# Patient Record
Sex: Male | Born: 1960 | Race: White | Hispanic: No | Smoking: Never smoker
Health system: Southern US, Community
[De-identification: ages and names within clinical notes are randomized; demographics above are authoritative.]

## PROBLEM LIST (undated history)

## (undated) DIAGNOSIS — I10 Essential (primary) hypertension: Secondary | ICD-10-CM

## (undated) DIAGNOSIS — M109 Gout, unspecified: Secondary | ICD-10-CM

## (undated) DIAGNOSIS — I219 Acute myocardial infarction, unspecified: Secondary | ICD-10-CM

## (undated) HISTORY — PX: CAROTID STENT INSERTION: SHX5766

## (undated) HISTORY — PX: CORONARY ARTERY BYPASS GRAFT: SHX141

---

## 2012-12-10 ENCOUNTER — Encounter: Payer: Self-pay | Admitting: *Deleted

## 2012-12-10 ENCOUNTER — Emergency Department (INDEPENDENT_AMBULATORY_CARE_PROVIDER_SITE_OTHER)
Admission: EM | Admit: 2012-12-10 | Discharge: 2012-12-10 | Disposition: A | Discharge status: Home / Self Care | Payer: BC Managed Care – PPO | Source: Home / Self Care | Attending: Family Medicine | Admitting: Family Medicine

## 2012-12-10 ENCOUNTER — Emergency Department (INDEPENDENT_AMBULATORY_CARE_PROVIDER_SITE_OTHER): Payer: BC Managed Care – PPO

## 2012-12-10 DIAGNOSIS — M25539 Pain in unspecified wrist: Secondary | ICD-10-CM

## 2012-12-10 DIAGNOSIS — M109 Gout, unspecified: Secondary | ICD-10-CM

## 2012-12-10 HISTORY — DX: Essential (primary) hypertension: I10

## 2012-12-10 HISTORY — DX: Gout, unspecified: M10.9

## 2012-12-10 HISTORY — DX: Acute myocardial infarction, unspecified: I21.9

## 2012-12-10 MED ORDER — PREDNISONE 50 MG PO TABS: ORAL_TABLET | ORAL | Status: DC

## 2012-12-10 NOTE — ED Notes (Signed)
Patient c/o right hand pain x 3 days unsure if injured works as a Therapist, sports. Declines tingling or numbness. States tried some of mother's Gout medicine and now having diarrhea.

## 2012-12-10 NOTE — ED Provider Notes (Signed)
History     CSN: [id number]  Arrival date & time 12/10/12  1255   First MD Initiated Contact with Patient 12/10/12 1307      Chief Complaint  Patient presents with  . Hand Pain   HPI  R wrist pain x  3 days.  Pt states that he noticed pain while at work but is unsure if he injured his hand.  Pain has been primarily in the wrist. Diffuse in nature. Feels more like an ache.  Has a hx/o gout in the wrist in the past.  Pain feels similar to previous gout flares.  Pt took some of mother's gout medication. Unsure of name of medication. Medication gave pt severe diarrhea.  Has had minimal to mild improvement in sxs.  No fevers or chills.  No hand numbness or paresthesias.  Mild weakness.   Past Medical History  Diagnosis Date  . Hypertension   . Gout   . Heart attack     Past Surgical History  Procedure Date  . Carotid stent insertion     No family history on file.  History  Substance Use Topics  . Smoking status: Not on file  . Smokeless tobacco: Not on file  . Alcohol Use:       Review of Systems  All other systems reviewed and are negative.    Allergies  Review of patient's allergies indicates no known allergies.  Home Medications   Current Outpatient Rx  Name  Route  Sig  Dispense  Refill  . ATORVASTATIN CALCIUM 80 MG PO TABS   Oral   Take 80 mg by mouth daily.         Marland Kitchen LISINOPRIL 20 MG PO TABS   Oral   Take 20 mg by mouth daily.           BP 127/85  Pulse 83  Temp 98.1 F (36.7 C) (Oral)  Resp 16  Ht 6\' 2"  (1.88 m)  Wt 283 lb 8 oz (128.595 kg)  BMI 36.40 kg/m2  SpO2 96%  Physical Exam  Constitutional:       Obese    HENT:  Head: Normocephalic and atraumatic.  Eyes: Conjunctivae normal are normal. Pupils are equal, round, and reactive to light.  Neck: Normal range of motion. Neck supple.  Cardiovascular: Normal rate and regular rhythm.   Pulmonary/Chest: Effort normal and breath sounds normal.  Abdominal: Soft.    Musculoskeletal:       Hands:      R wrist swelling.  Decreased ROM 2/2 pain  Neurovascularly intact distally  + wrist tenderness diffusely    Neurological: He is alert.  Skin: Skin is warm.    ED Course  Procedures (including critical care time)  Labs Reviewed - No data to display Dg Wrist Complete Right  12/10/2012  *RADIOLOGY REPORT*  Clinical Data: Hand and wrist pain.  No known injury.  RIGHT WRIST - COMPLETE 3+ VIEW  Comparison: None.  Findings: Imaged bones, joints and soft tissues appear normal.  IMPRESSION: Negative exam.   Original Report Authenticated By: Holley Dexter, M.D.    Dg Hand Complete Right  12/10/2012  *RADIOLOGY REPORT*  Clinical Data: Hand and wrist pain for 3 days, no known injury  RIGHT HAND - COMPLETE 3+ VIEW  Comparison: None  Findings: Question mild diffuse soft tissue swelling. Joint spaces preserved. Osseous mineralization probably normal for technique. Fingers partially superimposed on lateral view limiting evaluation. No acute fracture, dislocation or bone destruction.  IMPRESSION: No  acute osseous abnormalities.   Original Report Authenticated By: Ulyses Southward, M.D.      1. Wrist pain   2. Gout       MDM  xrays negative Suspect this is a gout flare.  Will place on course of prednisone for treatment.  Plan for follow up with sports medicine if not improved.     The patient and/or caregiver has been counseled thoroughly with regard to treatment plan and/or medications prescribed including dosage, schedule, interactions, rationale for use, and possible side effects and they verbalize understanding. Diagnoses and expected course of recovery discussed and will return if not improved as expected or if the condition worsens. Patient and/or caregiver verbalized understanding.             Doree Albee, MD 12/10/12 [phone]

## 2013-01-01 DIAGNOSIS — Z955 Presence of coronary angioplasty implant and graft: Secondary | ICD-10-CM

## 2013-01-01 DIAGNOSIS — I252 Old myocardial infarction: Secondary | ICD-10-CM

## 2013-01-01 DIAGNOSIS — I10 Essential (primary) hypertension: Secondary | ICD-10-CM | POA: Insufficient documentation

## 2013-01-01 HISTORY — DX: Essential (primary) hypertension: I10

## 2013-01-01 HISTORY — DX: Presence of coronary angioplasty implant and graft: Z95.5

## 2013-01-01 HISTORY — DX: Old myocardial infarction: I25.2

## 2013-01-23 DIAGNOSIS — L918 Other hypertrophic disorders of the skin: Secondary | ICD-10-CM | POA: Insufficient documentation

## 2016-10-01 ENCOUNTER — Encounter: Payer: Self-pay | Admitting: *Deleted

## 2016-10-01 ENCOUNTER — Emergency Department
Admission: EM | Admit: 2016-10-01 | Discharge: 2016-10-01 | Disposition: A | Discharge status: Home / Self Care | Payer: Self-pay | Source: Home / Self Care | Attending: Family Medicine | Admitting: Family Medicine

## 2016-10-01 DIAGNOSIS — I10 Essential (primary) hypertension: Secondary | ICD-10-CM

## 2016-10-01 DIAGNOSIS — L02213 Cutaneous abscess of chest wall: Secondary | ICD-10-CM

## 2016-10-01 MED ORDER — DOXYCYCLINE HYCLATE 100 MG PO CAPS: 100.0000 mg | ORAL_CAPSULE | Freq: Two times a day (BID) | ORAL | 0 refills | Status: DC

## 2016-10-01 MED ORDER — HYDROCODONE-ACETAMINOPHEN 5-325 MG PO TABS: 1.0000 | ORAL_TABLET | Freq: Four times a day (QID) | ORAL | 0 refills | Status: DC | PRN

## 2016-10-01 NOTE — ED Triage Notes (Signed)
Pt c/o 3 days of an abscess to central chest. No drainage yet but is coming to a head today. Feels well otherwise.

## 2016-10-01 NOTE — Discharge Instructions (Signed)
°  Norco/Vicodin (hydrocodone-acetaminophen) is a narcotic pain medication, do not combine these medications with others containing tylenol. While taking, do not drink alcohol, drive, or perform any other activities that requires focus while taking these medications.  ° °

## 2016-10-01 NOTE — ED Provider Notes (Signed)
CSN: [id number]     Arrival date & time 10/01/16  1612 History   First MD Initiated Contact with Patient 10/01/16 1658     Chief Complaint  Patient presents with  . Abscess   (Consider location/radiation/quality/duration/timing/severity/associated sxs/prior Treatment) HPI Jon Mcbride is a 55 y.o. male presenting to UC with c/o 3 days of gradually worsening redness, swelling and pain in center of his chest along his sternotomy scar from open heart surgery 1 year ago.  Pt notes the skin redness and swelling started just Left of the scar but gradually moved into the center of his chest onto his scar.  Area is tender to touch. Swelling has gradually improved with warm water and compresses but no bleeding or drainage from area.  Pain is 1/10 at this time but worse with palpation. Denies fever, chills, n/v/d.   Elevated BP in triage. Pt reports hx of known HTN and has been off his BP medication for over 1 year due to moving to area a few months ago and not having a PCP.  Pt has a new pt appointment with a PCP next week.  He notes his BP was lower than in triage at home but notes he is nervous about today's visit as he thinks the area needs to be drained.   Past Medical History:  Diagnosis Date  . Gout   . Heart attack   . Hypertension    Past Surgical History:  Procedure Laterality Date  . CAROTID STENT INSERTION     Family History  Problem Relation Age of Onset  . Hypertension Father    Social History  Substance Use Topics  . Smoking status: Never Smoker  . Smokeless tobacco: Never Used  . Alcohol use No    Review of Systems  Constitutional: Negative for chills, fatigue and fever.  Respiratory: Negative for chest tightness and shortness of breath.   Cardiovascular: Positive for chest pain (along scar on chest). Negative for palpitations.  Gastrointestinal: Negative for diarrhea, nausea and vomiting.  Skin: Positive for color change. Negative for wound.  Neurological: Negative  for dizziness, light-headedness and headaches.    Allergies  Review of patient's allergies indicates no known allergies.  Home Medications   Prior to Admission medications   Medication Sig Start Date End Date Taking? Authorizing Provider  aspirin 81 MG chewable tablet Chew by mouth daily.   Yes Historical Provider, MD  doxycycline (VIBRAMYCIN) 100 MG capsule Take 1 capsule (100 mg total) by mouth 2 (two) times daily. One po bid x 7 days 10/01/16   Junius Finner, PA-C  HYDROcodone-acetaminophen (NORCO/VICODIN) 5-325 MG tablet Take 1-2 tablets by mouth every 6 (six) hours as needed. 10/01/16   Junius Finner, PA-C   Meds Ordered and Administered this Visit  Medications - No data to display  BP (!) 160/111 (BP Location: Right Arm) Comment: 180/115 left arm  Pulse 88   Temp 98.8 F (37.1 C) (Oral)   Ht 6\' 2"  (1.88 m)   Wt 287 lb (130.2 kg)   SpO2 97%   BMI 36.85 kg/m  No data found.   Physical Exam  Constitutional: He is oriented to person, place, and time. He appears well-developed and well-nourished. No distress.  Pt sitting on exam chair, NAD  HENT:  Head: Normocephalic and atraumatic.  Eyes: EOM are normal.  Neck: Normal range of motion.  Cardiovascular: Normal rate and regular rhythm.   Pulmonary/Chest: Effort normal and breath sounds normal. No respiratory distress. He has no wheezes. He  has no rales. He exhibits tenderness.    Hypertrophic sternotomy scar- 2.5cm area of erythema and edema with centralized mild fluctuance and small pustular type appearance to skin. No active bleeding or discharge. Mildly tender.  Musculoskeletal: Normal range of motion.  Neurological: He is alert and oriented to person, place, and time.  Skin: Skin is warm and dry. He is not diaphoretic. There is erythema.  Psychiatric: He has a normal mood and affect. His behavior is normal.  Nursing note and vitals reviewed.   Urgent Care Course   Clinical Course    .Marland Kitchen.Incision and  Drainage Date/Time: 10/01/2016 5:53 PM Performed by: Junius Finner'MALLEY, Durwin Davisson Authorized by: Donna ChristenBEESE, STEPHEN A   Consent:    Consent obtained:  Verbal   Consent given by:  Patient   Risks discussed:  Bleeding, incomplete drainage, pain and infection   Alternatives discussed:  No treatment, alternative treatment, observation and delayed treatment Location:    Type:  Abscess   Size:  2.5   Location:  Trunk   Trunk location:  Chest Pre-procedure details:    Skin preparation:  Betadine Anesthesia (see MAR for exact dosages):    Anesthesia method:  Local infiltration   Local anesthetic:  Lidocaine 1% w/o epi Procedure type:    Complexity:  Simple Procedure details:    Needle aspiration: no     Incision types:  Elliptical   Incision depth:  Dermal   Scalpel blade:  11   Wound management:  Probed and deloculated   Drainage:  Bloody and purulent   Drainage amount:  Scant   Wound treatment:  Wound left open   Packing materials:  None Post-procedure details:    Patient tolerance of procedure:  Tolerated well, no immediate complications   (including critical care time)  Labs Review Labs Reviewed - No data to display  Imaging Review No results found.   MDM   1. Cutaneous abscess of chest wall   2. Uncontrolled hypertension    Pt presenting to UC with an abscess along hypertrophic sternotomy scar for about 3 days, gradually worsening.  No fever, n/v/d.   I&D performed- bloody discharge, scant pus (not enough to send for culture) Bandage applied.  BP elevated- pt aware his BP is uncontrolled, plans to f/u with a new PCP next week to get started back on his medications.  Rx: Doxycycline and norco Pt advised to f/u in 2-3 days for a wound recheck Home care instructions provided. Patient verbalized understanding and agreement with treatment plan.    Junius FinnerErin O'Malley, PA-C 10/01/16 1756

## 2016-10-04 ENCOUNTER — Telehealth: Payer: Self-pay | Admitting: *Deleted

## 2016-10-04 NOTE — Telephone Encounter (Signed)
Callback: Pt reports the site on his chest is much improved. Encouraged to complete antibiotic course.

## 2017-06-03 ENCOUNTER — Encounter: Payer: Self-pay | Admitting: Osteopathic Medicine

## 2017-06-03 ENCOUNTER — Ambulatory Visit (INDEPENDENT_AMBULATORY_CARE_PROVIDER_SITE_OTHER): Payer: 59 | Admitting: Osteopathic Medicine

## 2017-06-03 VITALS — BP 143/82 | HR 75 | Ht 74.0 in | Wt 290.0 lb

## 2017-06-03 DIAGNOSIS — Z1159 Encounter for screening for other viral diseases: Secondary | ICD-10-CM

## 2017-06-03 DIAGNOSIS — Z951 Presence of aortocoronary bypass graft: Secondary | ICD-10-CM

## 2017-06-03 DIAGNOSIS — L400 Psoriasis vulgaris: Secondary | ICD-10-CM

## 2017-06-03 DIAGNOSIS — Z125 Encounter for screening for malignant neoplasm of prostate: Secondary | ICD-10-CM | POA: Diagnosis not present

## 2017-06-03 DIAGNOSIS — I10 Essential (primary) hypertension: Secondary | ICD-10-CM

## 2017-06-03 HISTORY — DX: Presence of aortocoronary bypass graft: Z95.1

## 2017-06-03 MED ORDER — ATORVASTATIN CALCIUM 80 MG PO TABS: 80.0000 mg | ORAL_TABLET | Freq: Every day | ORAL | 1 refills | Status: DC

## 2017-06-03 MED ORDER — LOSARTAN POTASSIUM 25 MG PO TABS
25.0000 mg | ORAL_TABLET | Freq: Every day | ORAL | 1 refills | Status: DC
Start: 2017-06-03 — End: 2017-08-01

## 2017-06-03 MED ORDER — ASPIRIN 81 MG PO TBEC: 81.0000 mg | DELAYED_RELEASE_TABLET | Freq: Every day | ORAL | 1 refills | Status: DC

## 2017-06-03 MED ORDER — METOPROLOL SUCCINATE ER 25 MG PO TB24
25.0000 mg | ORAL_TABLET | Freq: Every day | ORAL | 1 refills | Status: DC
Start: 2017-06-03 — End: 2017-08-01

## 2017-06-03 MED ORDER — BETAMETHASONE DIPROPIONATE 0.05 % EX CREA: TOPICAL_CREAM | Freq: Two times a day (BID) | CUTANEOUS | 3 refills | Status: DC

## 2017-06-03 NOTE — Progress Notes (Signed)
HPI: Jon Mcbride is a 56 y.o. male  who presents to [facility] today, 06/03/17,  for chief complaint of:  Chief Complaint  Patient presents with  . Establish Care    Very pleasant new patient here to establish care. History of MI with subsequent CABG. He was without insurance for some time so on no medications for secondary prevention other than beta blocker with carvedilol. No chest pain, pressure, shortness of breath. No home blood pressures to report that he says he has a home blood pressure cuff.    Previous records reviewed: Last annual physical in 2014, medications included sildenafil 50 mg as needed, atorvastatin 80 mg daily, carvedilol 6.25 mg twice a day, clopidogrel 75 mg, lisinopril 20 mg daily, Nitrostat as needed. History of MI and coronary artery stent. Blood pressure at that time was 110/76.    Past medical, surgical, social and family history reviewed: There are no active problems to display for this patient.  Past Surgical History:  Procedure Laterality Date  . CAROTID STENT INSERTION     Social History  Substance Use Topics  . Smoking status: Never Smoker  . Smokeless tobacco: Never Used  . Alcohol use No   Family History  Problem Relation Age of Onset  . Hypertension Father   . Hyperlipidemia Father   . Cancer Mother   . Hyperlipidemia Mother      Current medication list and allergy/intolerance information reviewed:   Current Outpatient Prescriptions  Medication Sig Dispense Refill  . aspirin 81 MG chewable tablet Chew by mouth daily.    . carvedilol (COREG) 6.25 MG tablet Take 6.25 mg by mouth 2 (two) times daily with a meal.  3   No current facility-administered medications for this visit.    No Known Allergies    Review of Systems:  Constitutional:  No  fever, no chills, No recent illness.   HEENT: No  headache, no vision change  Cardiac: No  chest pain, No  pressure, No palpitations, No   Orthopnea  Respiratory:  No  shortness of breath. No  Cough  Gastrointestinal: No  abdominal pain, No  nausea, No  vomiting,  No  blood in stool, No  diarrhea, No  constipation   Musculoskeletal: No new myalgia/arthralgia  Skin: No  Rash, No other wounds/concerning lesions  Endocrine: No cold intolerance,  No heat intolerance.  Neurologic: No  weakness, No  dizziness  Psychiatric: No  concerns with depression, No  concerns with anxiety, No sleep problems, No mood problems  Exam:  BP (!) 143/82   Pulse 75   Ht 6\' 2"  (1.88 m)   Wt 290 lb (131.5 kg)   BMI 37.23 kg/m   Constitutional: VS see above. General Appearance: alert, well-developed, well-nourished, NAD  Eyes: Normal lids and conjunctive, non-icteric sclera  Ears, Nose, Mouth, Throat: MMM, Normal external inspection ears/nares/mouth/lips/gums.  Neck: No masses, trachea midline. No thyroid enlargement. No tenderness/mass appreciated. No lymphadenopathy  Respiratory: Normal respiratory effort. no wheeze, no rhonchi, no rales  Cardiovascular: S1/S2 normal, no murmur, no rub/gallop auscultated. RRR. No lower extremity edema. .   Musculoskeletal: Gait normal. No clubbing/cyanosis of digits.   Neurological: Normal balance/coordination. No tremor.   Skin: warm, dry, intact. Scaly patches on arm/leg consistent with psoriasis plaque, no erythema/ulceration/drainage  Psychiatric: Normal judgment/insight. Normal mood and affect. Oriented x3.    Results for orders placed or performed in visit on 06/03/17 (from the past 72 hour(s))  CBC  Status: None   Collection Time: 06/03/17  3:29 PM  Result Value Ref Range   WBC 8.8 3.8 - 10.8 K/uL   RBC 5.16 4.20 - 5.80 MIL/uL   Hemoglobin 15.4 13.2 - 17.1 g/dL   HCT 16.1 09.6 - 04.5 %   MCV 89.1 80.0 - 100.0 fL   MCH 29.8 27.0 - 33.0 pg   MCHC 33.5 32.0 - 36.0 g/dL   RDW 40.9 81.1 - 91.4 %   Platelets 376 140 - 400 K/uL   MPV 9.6 7.5 - 12.5 fL  COMPLETE METABOLIC PANEL WITH  GFR     Status: Abnormal   Collection Time: 06/03/17  3:29 PM  Result Value Ref Range   Sodium 139 135 - 146 mmol/L   Potassium 4.6 3.5 - 5.3 mmol/L   Chloride 103 98 - 110 mmol/L   CO2 25 20 - 31 mmol/L   Glucose, Bld 104 (H) 65 - 99 mg/dL   BUN 13 7 - 25 mg/dL   Creat 7.82 9.56 - 2.13 mg/dL    Comment:   For patients > or = 57 years of age: The upper reference limit for Creatinine is approximately 13% higher for people identified as African-American.      Total Bilirubin 0.5 0.2 - 1.2 mg/dL   Alkaline Phosphatase 67 40 - 115 U/L   AST 27 10 - 35 U/L   ALT 30 9 - 46 U/L   Total Protein 7.5 6.1 - 8.1 g/dL   Albumin 4.2 3.6 - 5.1 g/dL   Calcium 9.2 8.6 - 08.6 mg/dL   GFR, Est African American >89 >=60 mL/min   GFR, Est Non African American >89 >=60 mL/min  Lipid panel     Status: Abnormal   Collection Time: 06/03/17  3:29 PM  Result Value Ref Range   Cholesterol 214 (H) <200 mg/dL   Triglycerides 578 (H) <150 mg/dL   HDL 25 (L) >46 mg/dL   Total CHOL/HDL Ratio 8.6 (H) <5.0 Ratio   VLDL 54 (H) <30 mg/dL   LDL Cholesterol 962 (H) <100 mg/dL  Hemoglobin X5M     Status: None   Collection Time: 06/03/17  3:29 PM  Result Value Ref Range   Hgb A1c MFr Bld 5.6 <5.7 %    Comment:   For the purpose of screening for the presence of diabetes:   <5.7%       Consistent with the absence of diabetes 5.7-6.4 %   Consistent with increased risk for diabetes (prediabetes) >=6.5 %     Consistent with diabetes   This assay result is consistent with a decreased risk of diabetes.   Currently, no consensus exists regarding use of hemoglobin A1c for diagnosis of diabetes in children.   According to American Diabetes Association (ADA) guidelines, hemoglobin A1c <7.0% represents optimal control in non-pregnant diabetic patients. Different metrics may apply to specific patient populations. Standards of Medical Care in Diabetes (ADA).      Mean Plasma Glucose 114 mg/dL  Hepatitis C antibody      Status: None   Collection Time: 06/03/17  3:29 PM  Result Value Ref Range   HCV Ab NEGATIVE NEGATIVE  PSA, Total with Reflex to PSA, Free     Status: None (Preliminary result)   Collection Time: 06/03/17  3:29 PM  Result Value Ref Range   PSA, Total  ng/mL      ASSESSMENT/PLAN:   Hx of CABG - Restarted secondary prevention with aspirin enteric-coated, high potency statin, ARB  and beta blocker - Plan: aspirin 81 MG EC tablet, metoprolol succinate (TOPROL-XL) 25 MG 24 hr tablet, losartan (COZAAR) 25 MG tablet, atorvastatin (LIPITOR) 80 MG tablet, CBC, COMPLETE METABOLIC PANEL WITH GFR, Lipid panel, Hemoglobin A1c  HTN (hypertension), benign - Plan: metoprolol succinate (TOPROL-XL) 25 MG 24 hr tablet, losartan (COZAAR) 25 MG tablet, CBC, COMPLETE METABOLIC PANEL WITH GFR, Lipid panel, Hemoglobin A1c  Need for hepatitis C screening test - Plan: Hepatitis C antibody  Prostate cancer screening - Plan: PSA, Total with Reflex to PSA, Free  Plaque psoriasis - Plan: betamethasone dipropionate (DIPROLENE) 0.05 % cream     Visit summary with medication list and pertinent instructions was printed for patient to review. All questions at time of visit were answered - patient instructed to contact office with any additional concerns. ER/RTC precautions were reviewed with the patient. Follow-up plan: Return in about 2 weeks (around 06/17/2017) for recheck blood pressure .

## 2017-06-04 LAB — COMPLETE METABOLIC PANEL WITH GFR
ALT: 30 U/L (ref 9–46)
AST: 27 U/L (ref 10–35)
Albumin: 4.2 g/dL (ref 3.6–5.1)
Alkaline Phosphatase: 67 U/L (ref 40–115)
BUN: 13 mg/dL (ref 7–25)
CHLORIDE: 103 mmol/L (ref 98–110)
CO2: 25 mmol/L (ref 20–31)
Calcium: 9.2 mg/dL (ref 8.6–10.3)
Creat: 0.93 mg/dL (ref 0.70–1.33)
Glucose, Bld: 104 mg/dL — ABNORMAL HIGH (ref 65–99)
POTASSIUM: 4.6 mmol/L (ref 3.5–5.3)
Sodium: 139 mmol/L (ref 135–146)
Total Bilirubin: 0.5 mg/dL (ref 0.2–1.2)
Total Protein: 7.5 g/dL (ref 6.1–8.1)

## 2017-06-04 LAB — CBC
HEMATOCRIT: 46 % (ref 38.5–50.0)
Hemoglobin: 15.4 g/dL (ref 13.2–17.1)
MCH: 29.8 pg (ref 27.0–33.0)
MCHC: 33.5 g/dL (ref 32.0–36.0)
MCV: 89.1 fL (ref 80.0–100.0)
MPV: 9.6 fL (ref 7.5–12.5)
PLATELETS: 376 10*3/uL (ref 140–400)
RBC: 5.16 MIL/uL (ref 4.20–5.80)
RDW: 13.8 % (ref 11.0–15.0)
WBC: 8.8 10*3/uL (ref 3.8–10.8)

## 2017-06-04 LAB — LIPID PANEL
CHOL/HDL RATIO: 8.6 ratio — AB (ref <5.0)
Cholesterol: 214 mg/dL — ABNORMAL HIGH (ref <200)
HDL: 25 mg/dL — AB (ref >40)
LDL Cholesterol: 135 mg/dL — ABNORMAL HIGH (ref <100)
TRIGLYCERIDES: 272 mg/dL — AB (ref <150)
VLDL: 54 mg/dL — AB (ref <30)

## 2017-06-04 LAB — PSA, TOTAL WITH REFLEX TO PSA, FREE: PSA, Total: 0.9 ng/mL (ref <=4.0)

## 2017-06-04 LAB — HEMOGLOBIN A1C
Hgb A1c MFr Bld: 5.6 % (ref <5.7)
Mean Plasma Glucose: 114 mg/dL

## 2017-06-04 LAB — HEPATITIS C ANTIBODY: HCV AB: NEGATIVE

## 2017-06-17 ENCOUNTER — Encounter: Payer: Self-pay | Admitting: Osteopathic Medicine

## 2017-06-17 ENCOUNTER — Ambulatory Visit (INDEPENDENT_AMBULATORY_CARE_PROVIDER_SITE_OTHER): Payer: 59 | Admitting: Osteopathic Medicine

## 2017-06-17 VITALS — BP 126/79 | HR 63 | Wt 290.0 lb

## 2017-06-17 DIAGNOSIS — Z951 Presence of aortocoronary bypass graft: Secondary | ICD-10-CM

## 2017-06-17 DIAGNOSIS — I10 Essential (primary) hypertension: Secondary | ICD-10-CM | POA: Diagnosis not present

## 2017-06-17 MED ORDER — NITROGLYCERIN 0.4 MG SL SUBL: 0.4000 mg | SUBLINGUAL_TABLET | SUBLINGUAL | 0 refills | Status: DC | PRN

## 2017-06-17 NOTE — Patient Instructions (Addendum)
Plan: Tums or water if heartburn Nitro pills if persistent pain - see Rx bottle  Labs done next 4-6 weeks or so

## 2017-06-17 NOTE — Progress Notes (Signed)
HPI: Jon Mcbride is a 55 y.o. male  who presents to [facility] today, 06/17/17,  for chief complaint of:  Chief Complaint  Patient presents with  . Hypertension   Follow-up after restarting BP medications. Pt feeling well, no complaints. No CP/SOB.   Occasional chest twinges that resolves when he has something to drink. No angina.  No dyspnea    Past medical history, surgical history, social history and family history reviewed.  Patient Active Problem List   Diagnosis Date Noted  . 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 HTN (hypertension), benign 01/01/2013  . Stented coronary artery 01/01/2013    Current medication list and allergy/intolerance information reviewed.   Current Outpatient Prescriptions on File Prior to Visit  Medication Sig Dispense Refill  . aspirin 81 MG EC tablet Take 1 tablet (81 mg total) by mouth daily. Swallow whole. 30 tablet 1  . atorvastatin (LIPITOR) 80 MG tablet Take 1 tablet (80 mg total) by mouth daily. 30 tablet 1  . betamethasone dipropionate (DIPROLENE) 0.05 % cream Apply topically 2 (two) times daily. To affected area(s) as needed fo rup to two weeks 45 g 3  . losartan (COZAAR) 25 MG tablet Take 1 tablet (25 mg total) by mouth daily. 30 tablet 1  . metoprolol succinate (TOPROL-XL) 25 MG 24 hr tablet Take 1 tablet (25 mg total) by mouth daily. 30 tablet 1   No current facility-administered medications on file prior to visit.    No Known Allergies    Review of Systems:  Constitutional: No recent illness  HEENT: No  headache, no vision change  Cardiac: No  chest pain, No  pressure, No palpitations  Respiratory:  No  shortness of breath. No  Cough  Neurologic: No  weakness, No  Dizziness   Exam:  BP 126/79   Pulse 63   Wt 290 lb (131.5 kg)   SpO2 97%   BMI 37.23 kg/m   Constitutional: VS see above. General Appearance: alert, well-developed, well-nourished, NAD  Eyes: Normal lids and  conjunctive, non-icteric sclera  Ears, Nose, Mouth, Throat: MMM, Normal external inspection ears/nares/mouth/lips/gums.  Neck: No masses, trachea midline.   Respiratory: Normal respiratory effort. no wheeze, no rhonchi, no rales  Cardiovascular: S1/S2 normal, no murmur, no rub/gallop auscultated. RRR.   Musculoskeletal: Gait normal. Symmetric and independent movement of all extremities  Neurological: Normal balance/coordination. No tremor.  Skin: warm, dry, intact.   Psychiatric: Normal judgment/insight. Normal mood and affect. Oriented x3.    Recent Results (from the past 2160 hour(s))  CBC     Status: None   Collection Time: 06/03/17  3:29 PM  Result Value Ref Range   WBC 8.8 3.8 - 10.8 K/uL   RBC 5.16 4.20 - 5.80 MIL/uL   Hemoglobin 15.4 13.2 - 17.1 g/dL   HCT 16.1 09.6 - 04.5 %   MCV 89.1 80.0 - 100.0 fL   MCH 29.8 27.0 - 33.0 pg   MCHC 33.5 32.0 - 36.0 g/dL   RDW 40.9 81.1 - 91.4 %   Platelets 376 140 - 400 K/uL   MPV 9.6 7.5 - 12.5 fL  COMPLETE METABOLIC PANEL WITH GFR     Status: Abnormal   Collection Time: 06/03/17  3:29 PM  Result Value Ref Range   Sodium 139 135 - 146 mmol/L   Potassium 4.6 3.5 - 5.3 mmol/L   Chloride 103 98 - 110 mmol/L   CO2 25 20 - 31 mmol/L   Glucose, Bld  104 (H) 65 - 99 mg/dL   BUN 13 7 - 25 mg/dL   Creat 5.37 4.82 - 7.07 mg/dL    Comment:   For patients > or = 57 years of age: The upper reference limit for Creatinine is approximately 13% higher for people identified as African-American.      Total Bilirubin 0.5 0.2 - 1.2 mg/dL   Alkaline Phosphatase 67 40 - 115 U/L   AST 27 10 - 35 U/L   ALT 30 9 - 46 U/L   Total Protein 7.5 6.1 - 8.1 g/dL   Albumin 4.2 3.6 - 5.1 g/dL   Calcium 9.2 8.6 - 86.7 mg/dL   GFR, Est African American >89 >=60 mL/min   GFR, Est Non African American >89 >=60 mL/min  Lipid panel     Status: Abnormal   Collection Time: 06/03/17  3:29 PM  Result Value Ref Range   Cholesterol 214 (H) <200 mg/dL    Triglycerides 544 (H) <150 mg/dL   HDL 25 (L) >92 mg/dL   Total CHOL/HDL Ratio 8.6 (H) <5.0 Ratio   VLDL 54 (H) <30 mg/dL   LDL Cholesterol 010 (H) <100 mg/dL  Hemoglobin O7H     Status: None   Collection Time: 06/03/17  3:29 PM  Result Value Ref Range   Hgb A1c MFr Bld 5.6 <5.7 %    Comment:   For the purpose of screening for the presence of diabetes:   <5.7%       Consistent with the absence of diabetes 5.7-6.4 %   Consistent with increased risk for diabetes (prediabetes) >=6.5 %     Consistent with diabetes   This assay result is consistent with a decreased risk of diabetes.   Currently, no consensus exists regarding use of hemoglobin A1c for diagnosis of diabetes in children.   According to American Diabetes Association (ADA) guidelines, hemoglobin A1c <7.0% represents optimal control in non-pregnant diabetic patients. Different metrics may apply to specific patient populations. Standards of Medical Care in Diabetes (ADA).      Mean Plasma Glucose 114 mg/dL  Hepatitis C antibody     Status: None   Collection Time: 06/03/17  3:29 PM  Result Value Ref Range   HCV Ab NEGATIVE NEGATIVE  PSA, Total with Reflex to PSA, Free     Status: None   Collection Time: 06/03/17  3:29 PM  Result Value Ref Range   PSA, Total 0.9 <=4.0 ng/mL    Comment:   The Total PSA value from this assay system is standardized against the equimolar PSA standard. The test result will be approximately 20% higher when compared to the [facility] Total PSA (Siemens assay). Comparison of serial PSA results should be interpreted with this fact in mind.   PSA was performed using the Beckman Coulter Immunoassay method. Values obtained from different assay methods cannot be used interchangeably. PSA levels, regardless of value, should not be interpreted as absolute evidence of the presence or absence of disease.       No results found.  No flowsheet data found.  Depression screen PHQ 2/9  06/03/2017  Decreased Interest 1  Down, Depressed, Hopeless 1  PHQ - 2 Score 2  Altered sleeping 1  Tired, decreased energy 2  Change in appetite 0  Feeling bad or failure about yourself  2  Trouble concentrating 0  Moving slowly or fidgety/restless 0  Suicidal thoughts 0  PHQ-9 Score 7      ASSESSMENT/PLAN: Have nitro on hand just in  case but sounds more like GERD or esophageal spasm. BP much better, will recheck labs in a few weeks   Hx of CABG - Plan: COMPLETE METABOLIC PANEL WITH GFR, Lipid panel  HTN (hypertension), benign - Plan: COMPLETE METABOLIC PANEL WITH GFR, Lipid panel    Patient Instructions  Plan: Tums or water if heartburn Nitro pills if persistent pain - see Rx bottle  Labs done next 4-6 weeks or so     Follow-up plan: Return in about 6 months (around 12/18/2017) for RECHECK BLOOD PRESSURE .  Visit summary with medication list and pertinent instructions was printed for patient to review, alert [place] if any changes needed. All questions at time of visit were answered - patient instructed to contact office with any additional concerns. ER/RTC precautions were reviewed with the patient and understanding verbalized.   Note: Total time spent 15 minutes, greater than 50% of the visit was spent face-to-face counseling and coordinating care for the following: The primary encounter diagnosis was Hx of CABG. A diagnosis of HTN (hypertension), benign was also pertinent to this visit.Marland Kitchen

## 2017-08-01 ENCOUNTER — Other Ambulatory Visit: Payer: Self-pay | Admitting: Osteopathic Medicine

## 2017-08-01 DIAGNOSIS — Z951 Presence of aortocoronary bypass graft: Secondary | ICD-10-CM

## 2017-08-01 DIAGNOSIS — I10 Essential (primary) hypertension: Secondary | ICD-10-CM

## 2017-10-12 ENCOUNTER — Other Ambulatory Visit: Payer: Self-pay | Admitting: Osteopathic Medicine

## 2017-10-12 DIAGNOSIS — I10 Essential (primary) hypertension: Secondary | ICD-10-CM

## 2017-10-12 DIAGNOSIS — Z951 Presence of aortocoronary bypass graft: Secondary | ICD-10-CM

## 2017-11-12 ENCOUNTER — Telehealth: Payer: Self-pay

## 2017-11-12 NOTE — Telephone Encounter (Signed)
Jon Mcbride called and would like to know if he could take Viagra? Please advise.

## 2017-11-13 MED ORDER — SILDENAFIL CITRATE 20 MG PO TABS
20.0000 mg | ORAL_TABLET | ORAL | 3 refills | Status: DC | PRN
Start: 2017-11-13 — End: 2020-10-03

## 2017-11-13 NOTE — Telephone Encounter (Signed)
Spoke to patient myself, he is not taking the nitro, he never even got this prescription filled.  Okay to go ahead and trial generic sildenafil

## 2017-12-18 ENCOUNTER — Ambulatory Visit: Payer: 59 | Admitting: Osteopathic Medicine

## 2017-12-20 ENCOUNTER — Other Ambulatory Visit: Payer: Self-pay | Admitting: Osteopathic Medicine

## 2017-12-20 DIAGNOSIS — I10 Essential (primary) hypertension: Secondary | ICD-10-CM

## 2017-12-20 DIAGNOSIS — Z951 Presence of aortocoronary bypass graft: Secondary | ICD-10-CM

## 2017-12-29 ENCOUNTER — Ambulatory Visit (INDEPENDENT_AMBULATORY_CARE_PROVIDER_SITE_OTHER): Payer: 59 | Admitting: Osteopathic Medicine

## 2017-12-29 ENCOUNTER — Encounter: Payer: Self-pay | Admitting: Osteopathic Medicine

## 2017-12-29 VITALS — BP 137/82 | HR 67 | Wt 304.2 lb

## 2017-12-29 DIAGNOSIS — I2583 Coronary atherosclerosis due to lipid rich plaque: Secondary | ICD-10-CM

## 2017-12-29 DIAGNOSIS — E782 Mixed hyperlipidemia: Secondary | ICD-10-CM

## 2017-12-29 DIAGNOSIS — Z23 Encounter for immunization: Secondary | ICD-10-CM

## 2017-12-29 DIAGNOSIS — R635 Abnormal weight gain: Secondary | ICD-10-CM | POA: Diagnosis not present

## 2017-12-29 DIAGNOSIS — Z951 Presence of aortocoronary bypass graft: Secondary | ICD-10-CM

## 2017-12-29 DIAGNOSIS — I10 Essential (primary) hypertension: Secondary | ICD-10-CM

## 2017-12-29 DIAGNOSIS — I2510 Atherosclerotic heart disease of native coronary artery without angina pectoris: Secondary | ICD-10-CM

## 2017-12-29 DIAGNOSIS — R5383 Other fatigue: Secondary | ICD-10-CM

## 2017-12-29 NOTE — Patient Instructions (Signed)
Mediterranean Diet A Mediterranean diet refers to food and lifestyle choices that are based on the traditions of countries located on the [place]. This way of eating has been shown to help prevent certain conditions and improve outcomes for people who have chronic diseases, like kidney disease and heart disease. What are tips for following this plan? Lifestyle  Cook and eat meals together with your family, when possible.  Drink enough fluid to keep your urine clear or pale yellow.  Be physically active every day. This includes: ? Aerobic exercise like running or swimming. ? Leisure activities like gardening, walking, or housework.  Get 7-8 hours of sleep each night.  If recommended by your health care provider, drink red wine in moderation. This means 1 glass a day for nonpregnant women and 2 glasses a day for men. A glass of wine equals 5 oz (150 mL). Reading food labels  Check the serving size of packaged foods. For foods such as rice and pasta, the serving size refers to the amount of cooked product, not dry.  Check the total fat in packaged foods. Avoid foods that have saturated fat or trans fats.  Check the ingredients list for added sugars, such as corn syrup. Shopping  At the grocery store, buy most of your food from the areas near the walls of the store. This includes: ? Fresh fruits and vegetables (produce). ? Grains, beans, nuts, and seeds. Some of these may be available in unpackaged forms or large amounts (in bulk). ? Fresh seafood. ? Poultry and eggs. ? Low-fat dairy products.  Buy whole ingredients instead of prepackaged foods.  Buy fresh fruits and vegetables in-season from local farmers markets.  Buy frozen fruits and vegetables in resealable bags.  If you do not have access to quality fresh seafood, buy precooked frozen shrimp or canned fish, such as tuna, salmon, or sardines.  Buy small amounts of raw or cooked vegetables, salads, or olives from the  deli or salad bar at your store.  Stock your pantry so you always have certain foods on hand, such as olive oil, canned tuna, canned tomatoes, rice, pasta, and beans. Cooking  Cook foods with extra-virgin olive oil instead of using butter or other vegetable oils.  Have meat as a side dish, and have vegetables or grains as your main dish. This means having meat in small portions or adding small amounts of meat to foods like pasta or stew.  Use beans or vegetables instead of meat in common dishes like chili or lasagna.  Experiment with different cooking methods. Try roasting or broiling vegetables instead of steaming or sauteing them.  Add frozen vegetables to soups, stews, pasta, or rice.  Add nuts or seeds for added healthy fat at each meal. You can add these to yogurt, salads, or vegetable dishes.  Marinate fish or vegetables using olive oil, lemon juice, garlic, and fresh herbs. Meal planning  Plan to eat 1 vegetarian meal one day each week. Try to work up to 2 vegetarian meals, if possible.  Eat seafood 2 or more times a week.  Have healthy snacks readily available, such as: ? Vegetable sticks with hummus. ? Greek yogurt. ? Fruit and nut trail mix.  Eat balanced meals throughout the week. This includes: ? Fruit: 2-3 servings a day ? Vegetables: 4-5 servings a day ? Low-fat dairy: 2 servings a day ? Fish, poultry, or lean meat: 1 serving a day ? Beans and legumes: 2 or more servings a week ? Nuts   and seeds: 1-2 servings a day ? Whole grains: 6-8 servings a day ? Extra-virgin olive oil: 3-4 servings a day  Limit red meat and sweets to only a few servings a month What are my food choices?  Mediterranean diet ? Recommended ? Grains: Whole-grain pasta. Brown rice. Bulgar wheat. Polenta. Couscous. Whole-wheat bread. Oatmeal. Quinoa. ? Vegetables: Artichokes. Beets. Broccoli. Cabbage. Carrots. Eggplant. Green beans. Chard. Kale. Spinach. Onions. Leeks. Peas. Squash.  Tomatoes. Peppers. Radishes. ? Fruits: Apples. Apricots. Avocado. Berries. Bananas. Cherries. Dates. Figs. Grapes. Lemons. Melon. Oranges. Peaches. Plums. Pomegranate. ? Meats and other protein foods: Beans. Almonds. Sunflower seeds. Pine nuts. Peanuts. Cod. Salmon. Scallops. Shrimp. Tuna. Tilapia. Clams. Oysters. Eggs. ? Dairy: Low-fat milk. Cheese. Greek yogurt. ? Beverages: Water. Red wine. Herbal tea. ? Fats and oils: Extra virgin olive oil. Avocado oil. Grape seed oil. ? Sweets and desserts: Greek yogurt with honey. Baked apples. Poached pears. Trail mix. ? Seasoning and other foods: Basil. Cilantro. Coriander. Cumin. Mint. Parsley. Sage. Rosemary. Tarragon. Garlic. Oregano. Thyme. Pepper. Balsalmic vinegar. Tahini. Hummus. Tomato sauce. Olives. Mushrooms. ? Limit these ? Grains: Prepackaged pasta or rice dishes. Prepackaged cereal with added sugar. ? Vegetables: Deep fried potatoes (french fries). ? Fruits: Fruit canned in syrup. ? Meats and other protein foods: Beef. Pork. Lamb. Poultry with skin. Hot dogs. Bacon. ? Dairy: Ice cream. Sour cream. Whole milk. ? Beverages: Juice. Sugar-sweetened soft drinks. Beer. Liquor and spirits. ? Fats and oils: Butter. Canola oil. Vegetable oil. Beef fat (tallow). Lard. ? Sweets and desserts: Cookies. Cakes. Pies. Candy. ? Seasoning and other foods: Mayonnaise. Premade sauces and marinades. ? The items listed may not be a complete list. Talk with your dietitian about what dietary choices are right for you. Summary  The Mediterranean diet includes both food and lifestyle choices.  Eat a variety of fresh fruits and vegetables, beans, nuts, seeds, and whole grains.  Limit the amount of red meat and sweets that you eat.  Talk with your health care provider about whether it is safe for you to drink red wine in moderation. This means 1 glass a day for nonpregnant women and 2 glasses a day for men. A glass of wine equals 5 oz (150 mL). This information  is not intended to replace advice given to you by your health care provider. Make sure you discuss any questions you have with your health care provider. Document Released: 07/25/2016 Document Revised: 08/27/2016 Document Reviewed: 07/25/2016 [employer] Interactive Patient Education  2018 [employer]  

## 2017-12-29 NOTE — Progress Notes (Signed)
HPI: Jon Mcbride is a 57 y.o. male who  has a past medical history of Gout, Heart attack (HCC), and Hypertension.  he presents to [facility] today, 12/29/17,  for chief complaint of:  Chief Complaint  Patient presents with  . Hypertension    ER followup - was having SOB, EKG was ok, was diagnosed with panic attack. Has been under a lot of stress lately, a lot of family issues, he is taken on a lot of increased responsibility caring for his elderly mother. He has not had any recurrence of shortness of breath, no chest pain, no symptoms on exertion.  Hypertension/coronary artery disease - as noted above, no chest pain or other concerning symptoms recently. Blood pressure as noted below, no home blood pressure numbers to report. Patient states that overall, he feels more tired and stressed out than he used to.      Past medical, surgical, social and family history reviewed:  Patient Active Problem List   Diagnosis Date Noted  . 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 HTN (hypertension), benign 01/01/2013  . Stented coronary artery 01/01/2013    Past Surgical History:  Procedure Laterality Date  . CAROTID STENT INSERTION      Social History   Tobacco Use  . Smoking status: Never Smoker  . Smokeless tobacco: Never Used  Substance Use Topics  . Alcohol use: No    Family History  Problem Relation Age of Onset  . Hypertension Father   . Hyperlipidemia Father   . Cancer Mother   . Hyperlipidemia Mother      Current medication list and allergy/intolerance information reviewed:    Current Outpatient Medications  Medication Sig Dispense Refill  . aspirin 81 MG EC tablet Take 1 tablet (81 mg total) by mouth daily. Swallow whole. Patient must keep appt w/pcp. 30 tablet 1  . atorvastatin (LIPITOR) 80 MG tablet Take 1 tablet (80 mg total) by mouth daily. Patient must keep appt w/pcp. 30 tablet 0  . betamethasone dipropionate (DIPROLENE)  0.05 % cream Apply topically 2 (two) times daily. To affected area(s) as needed fo rup to two weeks 45 g 3  . losartan (COZAAR) 25 MG tablet Take 1 tablet (25 mg total) by mouth daily. Patient must keep appt w/pcp. 30 tablet 0  . metoprolol succinate (TOPROL-XL) 25 MG 24 hr tablet Take 1 tablet (25 mg total) by mouth daily. Patient must keep appt w/pcp. 30 tablet 0  . sildenafil (REVATIO) 20 MG tablet Take 1-5 tablets (20-100 mg total) by mouth as needed (take prior to sex. Use lowest effective dose). DO NOT TAKE WITH NITRATES 50 tablet 3   No current facility-administered medications for this visit.     No Known Allergies    Review of Systems:  Constitutional:  No  fever, no chills, No recent illness, +unintentional weight gain. +significant fatigue.   HEENT: No  headache, no vision change  Cardiac: No  chest pain, No  pressure, No palpitations, No  Orthopnea, no lower extremity swelling  Respiratory:  No  shortness of breath. No  Cough  Gastrointestinal: No  abdominal pain, No  nausea, No  vomiting,  Musculoskeletal: No new myalgia/arthralgia  Skin: No  Rash  Endocrine: No cold intolerance,  No heat intolerance. No polyuria/polydipsia/polyphagia   Neurologic: No  weakness, No  dizziness  Psychiatric: No  concerns with depression, +concerns with anxiety, No sleep problems, No mood problems  Exam:  BP 137/82   Pulse 67  Wt (!) 304 lb 3.2 oz (138 kg)   BMI 39.06 kg/m   Constitutional: VS see above. General Appearance: alert, well-developed, well-nourished, NAD  Eyes: Normal lids and conjunctive, non-icteric sclera  Ears, Nose, Mouth, Throat: MMM, Normal external inspection ears/nares/mouth/lips/gums.   Neck: No masses, trachea midline. No thyroid enlargement.   Respiratory: Normal respiratory effort. no wheeze, no rhonchi, no rales  Cardiovascular: S1/S2 normal, no murmur, no rub/gallop auscultated. RRR. No lower extremity edema.   Musculoskeletal: Gait normal.    Neurological: Normal balance/coordination. No tremor.   Skin: warm, dry, intact. No rash/ulcer.   Psychiatric: Normal judgment/insight. Normal mood and affect. Oriented x3.      ASSESSMENT/PLAN: Discussed lifestyle modifications for healthy heart. He is on appropriate secondary prevention, we need to recheck lipids as LDL was definitely above goal last time. May need to strongly consider adding medications depending on what results show. Patient to come back for fasting labs.  Essential hypertension - Plan: COMPLETE METABOLIC PANEL WITH GFR, Lipid panel  Need for influenza vaccination - Plan: Flu Vaccine QUAD 6+ mos PF IM (Fluarix Quad PF)  History of quadruple bypass  Weight gain  Other fatigue  Coronary artery disease due to lipid rich plaque  Mixed hyperlipidemia    Patient Instructions  Mediterranean Diet A Mediterranean diet refers to food and lifestyle choices that are based on the traditions of countries located on the [employer] [place]. This way of eating has been shown to help prevent certain conditions and improve outcomes for people who have chronic diseases, like kidney disease and heart disease. What are tips for following this plan? Lifestyle  Cook and eat meals together with your family, when possible.  Drink enough fluid to keep your urine clear or pale yellow.  Be physically active every day. This includes: ? Aerobic exercise like running or swimming. ? Leisure activities like gardening, walking, or housework.  Get 7-8 hours of sleep each night.  If recommended by your health care provider, drink red wine in moderation. This means 1 glass a day for nonpregnant women and 2 glasses a day for men. A glass of wine equals 5 oz (150 mL). Reading food labels  Check the serving size of packaged foods. For foods such as rice and pasta, the serving size refers to the amount of cooked product, not dry.  Check the total fat in packaged foods. Avoid foods that  have saturated fat or trans fats.  Check the ingredients list for added sugars, such as corn syrup. Shopping  At the grocery store, buy most of your food from the areas near the walls of the store. This includes: ? Fresh fruits and vegetables (produce). ? Grains, beans, nuts, and seeds. Some of these may be available in unpackaged forms or large amounts (in bulk). ? Fresh seafood. ? Poultry and eggs. ? Low-fat dairy products.  Buy whole ingredients instead of prepackaged foods.  Buy fresh fruits and vegetables in-season from local farmers markets.  Buy frozen fruits and vegetables in resealable bags.  If you do not have access to quality fresh seafood, buy precooked frozen shrimp or canned fish, such as tuna, salmon, or sardines.  Buy small amounts of raw or cooked vegetables, salads, or olives from the deli or salad bar at your store.  Stock your pantry so you always have certain foods on hand, such as olive oil, canned tuna, canned tomatoes, rice, pasta, and beans. Cooking  Cook foods with extra-virgin olive oil instead of using butter or other  vegetable oils.  Have meat as a side dish, and have vegetables or grains as your main dish. This means having meat in small portions or adding small amounts of meat to foods like pasta or stew.  Use beans or vegetables instead of meat in common dishes like chili or lasagna.  Experiment with different cooking methods. Try roasting or broiling vegetables instead of steaming or sauteing them.  Add frozen vegetables to soups, stews, pasta, or rice.  Add nuts or seeds for added healthy fat at each meal. You can add these to yogurt, salads, or vegetable dishes.  Marinate fish or vegetables using olive oil, lemon juice, garlic, and fresh herbs. Meal planning  Plan to eat 1 vegetarian meal one day each week. Try to work up to 2 vegetarian meals, if possible.  Eat seafood 2 or more times a week.  Have healthy snacks readily available,  such as: ? Vegetable sticks with hummus. ? [place] yogurt. ? Fruit and nut trail mix.  Eat balanced meals throughout the week. This includes: ? Fruit: 2-3 servings a day ? Vegetables: 4-5 servings a day ? Low-fat dairy: 2 servings a day ? Fish, poultry, or lean meat: 1 serving a day ? Beans and legumes: 2 or more servings a week ? Nuts and seeds: 1-2 servings a day ? Whole grains: 6-8 servings a day ? Extra-virgin olive oil: 3-4 servings a day  Limit red meat and sweets to only a few servings a month What are my food choices?  Mediterranean diet ? Recommended ? Grains: Whole-grain pasta. Brown rice. Bulgar wheat. Polenta. Couscous. Whole-wheat bread. Orpah Cobb. ? Vegetables: Artichokes. Beets. Broccoli. Cabbage. Carrots. Eggplant. Green beans. Chard. Kale. Spinach. Onions. Leeks. Peas. Squash. Tomatoes. Peppers. Radishes. ? Fruits: Apples. Apricots. Avocado. Berries. Bananas. Cherries. Dates. Figs. Grapes. Lemons. Melon. Oranges. Peaches. Plums. Pomegranate. ? Meats and other protein foods: Beans. Almonds. Sunflower seeds. Pine nuts. Peanuts. Cod. Salmon. Scallops. Shrimp. Tuna. Tilapia. Clams. Oysters. Eggs. ? Dairy: Low-fat milk. Cheese. Greek yogurt. ? Beverages: Water. Red wine. Herbal tea. ? Fats and oils: Extra virgin olive oil. Avocado oil. Grape seed oil. ? Sweets and desserts: [place] yogurt with honey. Baked apples. Poached pears. Trail mix. ? Seasoning and other foods: Basil. Cilantro. Coriander. Cumin. Mint. Parsley. Sage. Rosemary. Tarragon. Garlic. Oregano. Thyme. Pepper. Balsalmic vinegar. Tahini. Hummus. Tomato sauce. Olives. Mushrooms. ? Limit these ? Grains: Prepackaged pasta or rice dishes. Prepackaged cereal with added sugar. ? Vegetables: Deep fried potatoes (french fries). ? Fruits: Fruit canned in syrup. ? Meats and other protein foods: Beef. Pork. Lamb. Poultry with skin. Hot dogs. Tomasa Blase. ? Dairy: Ice cream. Sour cream. Whole milk. ? Beverages: Juice.  Sugar-sweetened soft drinks. Beer. Liquor and spirits. ? Fats and oils: Butter. Canola oil. Vegetable oil. Beef fat (tallow). Lard. ? Sweets and desserts: Cookies. Cakes. Pies. Candy. ? Seasoning and other foods: Mayonnaise. Premade sauces and marinades. ? The items listed may not be a complete list. Talk with your dietitian about what dietary choices are right for you. Summary  The Mediterranean diet includes both food and lifestyle choices.  Eat a variety of fresh fruits and vegetables, beans, nuts, seeds, and whole grains.  Limit the amount of red meat and sweets that you eat.  Talk with your health care provider about whether it is safe for you to drink red wine in moderation. This means 1 glass a day for nonpregnant women and 2 glasses a day for men. A glass of wine equals 5 oz (  150 mL). This information is not intended to replace advice given to you by your health care provider. Make sure you discuss any questions you have with your health care provider. Document Released: 07/25/2016 Document Revised: 08/27/2016 Document Reviewed: 07/25/2016 Elsevier Interactive Patient Education  2018 [employer].     Visit summary with medication list and pertinent instructions was printed for patient to review. All questions at time of visit were answered - patient instructed to contact office with any additional concerns. ER/RTC precautions were reviewed with the patient.   Follow-up plan: Return in about 6 months (around 06/28/2018) for recheck blood pressure . Depending on lab results. Patient was encouraged to come back in the next 6 weeks or so for weight check. Would like. He would like to avoid anti-obesity medications, aware that he needs to get back to diet/exercise changes  Note: Total time spent 25 minutes, greater than 50% of the visit was spent face-to-face counseling and coordinating care for the following: The primary encounter diagnosis was Essential hypertension. Diagnoses of Need  for influenza vaccination, History of quadruple bypass, Weight gain, Other fatigue, Coronary artery disease due to lipid rich plaque, and Mixed hyperlipidemia were also pertinent to this visit.Marland Kitchen  Please note: voice recognition software was used to produce this document, and typos may escape review. Please contact Dr. Lyn Hollingshead for any needed clarifications.

## 2017-12-30 ENCOUNTER — Encounter: Payer: Self-pay | Admitting: Osteopathic Medicine

## 2018-01-29 ENCOUNTER — Other Ambulatory Visit: Payer: Self-pay | Admitting: Osteopathic Medicine

## 2018-01-29 DIAGNOSIS — I10 Essential (primary) hypertension: Secondary | ICD-10-CM

## 2018-01-29 DIAGNOSIS — Z951 Presence of aortocoronary bypass graft: Secondary | ICD-10-CM

## 2018-02-26 ENCOUNTER — Encounter: Payer: Self-pay | Admitting: Osteopathic Medicine

## 2018-02-26 ENCOUNTER — Ambulatory Visit: Payer: 59 | Admitting: Osteopathic Medicine

## 2018-02-26 VITALS — BP 117/69 | HR 58 | Temp 98.2°F | Wt 303.0 lb

## 2018-02-26 DIAGNOSIS — R5382 Chronic fatigue, unspecified: Secondary | ICD-10-CM | POA: Diagnosis not present

## 2018-02-26 DIAGNOSIS — G4733 Obstructive sleep apnea (adult) (pediatric): Secondary | ICD-10-CM | POA: Insufficient documentation

## 2018-02-26 HISTORY — DX: Obstructive sleep apnea (adult) (pediatric): G47.33

## 2018-02-26 NOTE — Patient Instructions (Signed)

## 2018-02-26 NOTE — Progress Notes (Signed)
HPI: Jon Mcbride is a 57 y.o. male who  has a past medical history of Gout, Heart attack (HCC), and Hypertension.  he presents to [facility] today, 02/26/18,  for chief complaint of:  fatigue  Feels normal overall, no exercise tolerance changes, just feeling really tired worse over the past few months. Living with and caring for his ill mother, not much help from siblings. Knee pain but no myalgia. No headache. Has been walking a mile at the treadmill usually daily. Not consistent about CPAP use   He attributes it possibly to the cholesterol medication, parents also had some issues with this medicine. He has been on it for some time, though. No muscle aches. Overdue for lipid lab recheck.     Past medical history, surgical history, social history and family history reviewed. No updates needed.   Current medication list and allergy/intolerance information reviewed.    Current Outpatient Medications on File Prior to Visit  Medication Sig Dispense Refill  . aspirin 81 MG EC tablet Take 1 tablet (81 mg total) by mouth daily. Swallow whole. Patient must keep appt w/pcp. 30 tablet 1  . atorvastatin (LIPITOR) 80 MG tablet Take 1 tablet (80 mg total) by mouth daily. Patient must completed labs. 30 tablet 0  . betamethasone dipropionate (DIPROLENE) 0.05 % cream Apply topically 2 (two) times daily. To affected area(s) as needed fo rup to two weeks 45 g 3  . losartan (COZAAR) 25 MG tablet TAKE 1 TABLET (25 MG TOTAL) BY MOUTH DAILY. PATIENT MUST KEEP APPT W/PCP. 30 tablet 0  . metoprolol succinate (TOPROL-XL) 25 MG 24 hr tablet TAKE 1 TABLET (25 MG TOTAL) BY MOUTH DAILY. PATIENT MUST KEEP APPT W/PCP. 30 tablet 0  . sildenafil (REVATIO) 20 MG tablet Take 1-5 tablets (20-100 mg total) by mouth as needed (take prior to sex. Use lowest effective dose). DO NOT TAKE WITH NITRATES 50 tablet 3   No current facility-administered medications on file prior to visit.    No  Known Allergies    Review of Systems:  Constitutional: No recent illness or illness prior to fatigue onset   HEENT: No  headache, no vision change  Cardiac: No  chest pain, No  pressure, No palpitations  Respiratory:  No  shortness of breath. No  Cough  Gastrointestinal: No  abdominal pain, no change on bowel habits  Musculoskeletal: No new myalgia/arthralgia  Skin: No  Rash  Hem/Onc: No  easy bruising/bleeding, No  abnormal lumps/bumps  Neurologic: No  weakness, No  Dizziness  Psychiatric: No  concerns with depression, No  concerns with anxiety  Exam:  BP 117/69   Pulse (!) 58   Temp 98.2 F (36.8 C) (Oral)   Wt (!) 303 lb (137.4 kg)   BMI 38.90 kg/m   Constitutional: VS see above. General Appearance: alert, well-developed, well-nourished, NAD  Eyes: Normal lids and conjunctive, non-icteric sclera  Ears, Nose, Mouth, Throat: MMM, Normal external inspection ears/nares/mouth/lips/gums.  Neck: No masses, trachea midline.   Respiratory: Normal respiratory effort. no wheeze, no rhonchi, no rales  Cardiovascular: S1/S2 normal, no murmur, no rub/gallop auscultated. RRR.   Gastro: No organomegaly though habitus limits exam. Nonternder, BS WNL x4   Musculoskeletal: Gait normal. Symmetric and independent movement of all extremities  Neurological: Normal balance/coordination. No tremor.  Skin: warm, dry, intact.   Psychiatric: Normal judgment/insight. Normal mood and affect. Oriented x3.     ASSESSMENT/PLAN:   Chronic fatigue - Plan: CBC with Differential/Platelet, COMPLETE  METABOLIC PANEL WITH GFR, Lipid panel, TSH, VITAMIN D 25 Hydroxy (Vit-D Deficiency, Fractures), Urinalysis, Routine w reflex microscopic  Obstructive sleep apnea   Advised consistent CPAP use for next few weeks and f/u if still bothersome. Doesn't sound like statin effect or postviral fatigue syndrome, consider seasonal affective or dysthymia.    Follow-up plan: Return for recheck fatigue 2  weeks (02/11/18 approx) .  Visit summary with medication list and pertinent instructions was printed for patient to review, alert [place] if any changes needed. All questions at time of visit were answered - patient instructed to contact office with any additional concerns. ER/RTC precautions were reviewed with the patient and understanding verbalized.     Please note: voice recognition software was used to produce this document, and typos may escape review. Please contact Dr. Lyn Hollingshead for any needed clarifications.

## 2018-02-27 LAB — COMPLETE METABOLIC PANEL WITH GFR
AG Ratio: 1.3 (calc) (ref 1.0–2.5)
ALKALINE PHOSPHATASE (APISO): 59 U/L (ref 40–115)
ALT: 25 U/L (ref 9–46)
AST: 27 U/L (ref 10–35)
Albumin: 4 g/dL (ref 3.6–5.1)
BILIRUBIN TOTAL: 0.4 mg/dL (ref 0.2–1.2)
BUN: 13 mg/dL (ref 7–25)
CHLORIDE: 108 mmol/L (ref 98–110)
CO2: 24 mmol/L (ref 20–32)
Calcium: 9.1 mg/dL (ref 8.6–10.3)
Creat: 0.97 mg/dL (ref 0.70–1.33)
GFR, Est African American: 100 mL/min/{1.73_m2} (ref >=60)
GFR, Est Non African American: 86 mL/min/{1.73_m2} (ref >=60)
GLUCOSE: 107 mg/dL — AB (ref 65–99)
Globulin: 3.2 g/dL (calc) (ref 1.9–3.7)
Potassium: 5 mmol/L (ref 3.5–5.3)
Sodium: 140 mmol/L (ref 135–146)
Total Protein: 7.2 g/dL (ref 6.1–8.1)

## 2018-02-27 LAB — CBC WITH DIFFERENTIAL/PLATELET
BASOS PCT: 1.3 %
Basophils Absolute: 107 cells/uL (ref 0–200)
Eosinophils Absolute: 582 cells/uL — ABNORMAL HIGH (ref 15–500)
Eosinophils Relative: 7.1 %
HCT: 44.6 % (ref 38.5–50.0)
HEMOGLOBIN: 15.2 g/dL (ref 13.2–17.1)
Lymphs Abs: 2599 cells/uL (ref 850–3900)
MCH: 29.6 pg (ref 27.0–33.0)
MCHC: 34.1 g/dL (ref 32.0–36.0)
MCV: 86.9 fL (ref 80.0–100.0)
MPV: 10 fL (ref 7.5–12.5)
Monocytes Relative: 10.8 %
Neutro Abs: 4026 cells/uL (ref 1500–7800)
Neutrophils Relative %: 49.1 %
PLATELETS: 384 10*3/uL (ref 140–400)
RBC: 5.13 10*6/uL (ref 4.20–5.80)
RDW: 13 % (ref 11.0–15.0)
TOTAL LYMPHOCYTE: 31.7 %
WBC mixed population: 886 cells/uL (ref 200–950)
WBC: 8.2 10*3/uL (ref 3.8–10.8)

## 2018-02-27 LAB — URINALYSIS, ROUTINE W REFLEX MICROSCOPIC
Bacteria, UA: NONE SEEN /HPF
Bilirubin Urine: NEGATIVE
GLUCOSE, UA: NEGATIVE
HYALINE CAST: NONE SEEN /LPF
Ketones, ur: NEGATIVE
LEUKOCYTES UA: NEGATIVE
Nitrite: NEGATIVE
PH: 5.5 (ref 5.0–8.0)
Protein, ur: NEGATIVE
SQUAMOUS EPITHELIAL / LPF: NONE SEEN /HPF (ref <=5)
Specific Gravity, Urine: 1.024 (ref 1.001–1.03)

## 2018-02-27 LAB — LIPID PANEL
CHOLESTEROL: 116 mg/dL (ref <200)
HDL: 30 mg/dL — ABNORMAL LOW (ref >40)
LDL CHOLESTEROL (CALC): 67 mg/dL
Non-HDL Cholesterol (Calc): 86 mg/dL (calc) (ref <130)
TRIGLYCERIDES: 104 mg/dL (ref <150)
Total CHOL/HDL Ratio: 3.9 (calc) (ref <5.0)

## 2018-02-27 LAB — TSH: TSH: 2.39 mIU/L (ref 0.40–4.50)

## 2018-02-27 LAB — VITAMIN D 25 HYDROXY (VIT D DEFICIENCY, FRACTURES): Vit D, 25-Hydroxy: 20 ng/mL — ABNORMAL LOW (ref 30–100)

## 2018-03-05 ENCOUNTER — Other Ambulatory Visit: Payer: Self-pay | Admitting: Osteopathic Medicine

## 2018-03-05 DIAGNOSIS — Z951 Presence of aortocoronary bypass graft: Secondary | ICD-10-CM

## 2018-03-06 ENCOUNTER — Other Ambulatory Visit: Payer: Self-pay | Admitting: Osteopathic Medicine

## 2018-03-06 DIAGNOSIS — I10 Essential (primary) hypertension: Secondary | ICD-10-CM

## 2018-03-06 DIAGNOSIS — Z951 Presence of aortocoronary bypass graft: Secondary | ICD-10-CM

## 2018-03-11 ENCOUNTER — Ambulatory Visit: Payer: 59 | Admitting: Osteopathic Medicine

## 2018-03-11 DIAGNOSIS — Z0189 Encounter for other specified special examinations: Secondary | ICD-10-CM

## 2018-03-12 ENCOUNTER — Telehealth: Payer: Self-pay

## 2018-03-12 NOTE — Telephone Encounter (Signed)
As per pt - recently moved and changed phone number. Pt's current information is :   [phone] [address] [place], [place] [address]

## 2018-03-31 ENCOUNTER — Telehealth: Payer: Self-pay

## 2018-04-01 ENCOUNTER — Encounter: Payer: Self-pay | Admitting: Osteopathic Medicine

## 2018-04-01 ENCOUNTER — Ambulatory Visit: Payer: 59 | Admitting: Osteopathic Medicine

## 2018-04-01 VITALS — BP 129/76 | HR 67 | Temp 98.8°F | Wt 305.8 lb

## 2018-04-01 DIAGNOSIS — Z202 Contact with and (suspected) exposure to infections with a predominantly sexual mode of transmission: Secondary | ICD-10-CM | POA: Diagnosis not present

## 2018-04-01 NOTE — Progress Notes (Signed)
HPI: Jon Mcbride is a 57 y.o. male who  has a past medical history of Gout, Heart attack (HCC), and Hypertension.  he presents to [facility] today, 04/01/18,  for chief complaint of:  STD testing - possible exposure  He was dating a woman a year ago, they broke up in 11/2017. One episode of intercourse in their relationship. Health Dept showed up at his door few days ago and gave a letter to his mom a she wasn't home. He called the number listed in the letter, they did not want to disclose an infected party but informed him that he may have contracted syphilis. He of course suspects this ex.   No symptoms, see ROS  Recent isseus with fatigue have resolved. He credits taking Vitamin D supplements    Past medical history, surgical history, social history and family history reviewed.   Current medication list and allergy/intolerance information reviewed.    Current Outpatient Medications on File Prior to Visit  Medication Sig Dispense Refill  . aspirin 81 MG EC tablet TAKE 1 TABLET (81 MG TOTAL) BY MOUTH DAILY. SWALLOW WHOLE. PATIENT MUST KEEP APPT W/PCP. 90 tablet 1  . atorvastatin (LIPITOR) 80 MG tablet TAKE 1 TABLET BY MOUTH DAILY. PATIENT MUST COMPLETED LABS. 90 tablet 1  . betamethasone dipropionate (DIPROLENE) 0.05 % cream Apply topically 2 (two) times daily. To affected area(s) as needed fo rup to two weeks 45 g 3  . losartan (COZAAR) 25 MG tablet Take 1 tablet (25 mg total) by mouth daily. Patient must keep upcoming appt 90 tablet 1  . metoprolol succinate (TOPROL-XL) 25 MG 24 hr tablet Take 1 tablet (25 mg total) by mouth daily. Pt must keep upcoming appt 90 tablet 1  . sildenafil (REVATIO) 20 MG tablet Take 1-5 tablets (20-100 mg total) by mouth as needed (take prior to sex. Use lowest effective dose). DO NOT TAKE WITH NITRATES 50 tablet 3   No current facility-administered medications on file prior to visit.    No Known Allergies     Review of Systems:  Constitutional: No recent illness, no night sweat or fever.   HEENT: No  headache, no vision change  Cardiac: No  chest pain, No  pressure, No palpitations  Respiratory:  No  shortness of breath. No  Cough  Gastrointestinal: No  abdominal pain  Musculoskeletal: No new myalgia/arthralgia  Skin: No  Rash  Hem/Onc: No  easy bruising/bleeding, No  abnormal lumps/bumps  Neurologic: No  weakness, No  Dizziness   Exam:  BP 129/76 (BP Location: Left Arm, Patient Position: Sitting, Cuff Size: Large)   Pulse 67   Temp 98.8 F (37.1 C) (Oral)   Wt (!) 305 lb 12.8 oz (138.7 kg)   BMI 39.26 kg/m   Constitutional: VS see above. General Appearance: alert, well-developed, well-nourished, NAD  Eyes: Normal lids and conjunctive, non-icteric sclera  Ears, Nose, Mouth, Throat: MMM, Normal external inspection ears/nares/mouth/lips/gums.  Neck: No masses, trachea midline.   Respiratory: Normal respiratory effort.   Musculoskeletal: Gait normal. Symmetric and independent movement of all extremities  Neurological: Normal balance/coordination. No tremor.  Skin: warm, dry, intact.   Psychiatric: Normal judgment/insight. Normal mood and affect. Oriented x3.     ASSESSMENT/PLAN:   Exposure to STD - Plan: Hepatitis B core antibody, total, Hepatitis B surface antibody, Hepatitis B surface antigen, HIV antibody, RPR, Hepatitis C antibody, C. trachomatis/N. gonorrhoeae RNA     Patient Instructions  Will call you with results. If  there is evidence of syphilis or other infection, it will require treatment and monitoring! I always check for everything if there is a question of any possible infection.    Follow-up plan: Return in about 6 months (around 10/01/2018) for follow-up blood pressure, routine labs .  Visit summary with medication list and pertinent instructions was printed for patient to review, alert [place] if any changes needed. All questions at time of visit  were answered - patient instructed to contact office with any additional concerns. ER/RTC precautions were reviewed with the patient and understanding verbalized.   Note: Total time spent 15 minutes, greater than 50% of the visit was spent face-to-face counseling and coordinating care for the following: The encounter diagnosis was Exposure to STD.Marland Kitchen  Please note: voice recognition software was used to produce this document, and typos may escape review. Please contact Dr. Lyn Hollingshead for any needed clarifications.

## 2018-04-01 NOTE — Patient Instructions (Addendum)
Will call you with results. If there is evidence of syphilis or other infection, it will require treatment and monitoring! I always check for everything if there is a question of any possible infection.

## 2018-04-02 LAB — C. TRACHOMATIS/N. GONORRHOEAE RNA
C. trachomatis RNA, TMA: NOT DETECTED
N. gonorrhoeae RNA, TMA: NOT DETECTED

## 2018-04-02 LAB — RPR: RPR Ser Ql: NONREACTIVE

## 2018-04-02 LAB — HEPATITIS C ANTIBODY
HEP C AB: NONREACTIVE
SIGNAL TO CUT-OFF: 0.05 (ref <1.00)

## 2018-04-02 LAB — HIV ANTIBODY (ROUTINE TESTING W REFLEX): HIV: NONREACTIVE

## 2018-04-02 LAB — HEPATITIS B SURFACE ANTIBODY,QUALITATIVE: HEP B S AB: NONREACTIVE

## 2018-04-02 LAB — HEPATITIS B CORE ANTIBODY, TOTAL: HEP B C TOTAL AB: NONREACTIVE

## 2018-04-02 LAB — HEPATITIS B SURFACE ANTIGEN: HEP B S AG: NONREACTIVE

## 2018-05-01 NOTE — Telephone Encounter (Signed)
No additional encounter note found 

## 2018-06-29 ENCOUNTER — Ambulatory Visit: Payer: 59 | Admitting: Osteopathic Medicine

## 2018-08-31 ENCOUNTER — Ambulatory Visit (INDEPENDENT_AMBULATORY_CARE_PROVIDER_SITE_OTHER): Payer: Self-pay | Admitting: Osteopathic Medicine

## 2018-08-31 ENCOUNTER — Encounter: Payer: Self-pay | Admitting: Osteopathic Medicine

## 2018-08-31 DIAGNOSIS — I10 Essential (primary) hypertension: Secondary | ICD-10-CM

## 2018-08-31 DIAGNOSIS — Z951 Presence of aortocoronary bypass graft: Secondary | ICD-10-CM

## 2018-08-31 DIAGNOSIS — L400 Psoriasis vulgaris: Secondary | ICD-10-CM

## 2018-08-31 MED ORDER — PRAVASTATIN SODIUM 40 MG PO TABS: 40.0000 mg | ORAL_TABLET | Freq: Every day | ORAL | 3 refills | Status: DC

## 2018-08-31 MED ORDER — LOSARTAN POTASSIUM 25 MG PO TABS
25.0000 mg | ORAL_TABLET | Freq: Every day | ORAL | 3 refills | Status: DC
Start: 2018-08-31 — End: 2019-02-19

## 2018-08-31 MED ORDER — ASPIRIN 81 MG PO TBEC: 81.0000 mg | DELAYED_RELEASE_TABLET | Freq: Every day | ORAL | 3 refills | Status: DC

## 2018-08-31 MED ORDER — BETAMETHASONE DIPROPIONATE 0.05 % EX CREA: TOPICAL_CREAM | Freq: Two times a day (BID) | CUTANEOUS | 3 refills | Status: DC

## 2018-08-31 MED ORDER — METOPROLOL SUCCINATE ER 25 MG PO TB24: 25.0000 mg | ORAL_TABLET | Freq: Every day | ORAL | 3 refills | Status: DC

## 2018-08-31 NOTE — Progress Notes (Signed)
HPI: Jon Mcbride is a 57 y.o. male who  has a past medical history of Gout, Heart attack (HCC), and Hypertension.  he presents to [facility] today, 08/31/18,  for chief complaint of:  HTN, CAD Rash - psoriasis Needs refills   Atorvastatin caused body aches and this resolved after 4-5 days off meds, has been off this med for 8 months.   No insurance, took some time off work after the death of his mother, he plans to start working again soon and should have insurance back in the next couple of months.  No chest pain, pressure, shortness of breath.  Psoriasis is flaring up a bit, betamethasone cream has been effective but too expensive without insurance.  Moods have been a bit down since the death of his mother, he states he is overall coping pretty well, especially since getting a new Rottweiler puppy!     Past medical history, surgical history, and family history reviewed.  Current medication list and allergy/intolerance information reviewed.   (See remainder of HPI, ROS, Phys Exam below)    ASSESSMENT/PLAN:   Hx of CABG - Restarted secondary prevention with aspirin enteric-coated, ARB and beta blocker. Relative intolerance to statin, will trial pravastatin and CoQ10 - Plan: metoprolol succinate (TOPROL-XL) 25 MG 24 hr tablet, aspirin 81 MG EC tablet, losartan (COZAAR) 25 MG tablet, pravastatin (PRAVACHOL) 40 MG tablet  HTN (hypertension), benign - Plan: metoprolol succinate (TOPROL-XL) 25 MG 24 hr tablet, losartan (COZAAR) 25 MG tablet  Plaque psoriasis - [email or web address] coupon printed for the topical steroid - Plan: betamethasone dipropionate (DIPROLENE) 0.05 % cream   Meds ordered this encounter  Medications  . metoprolol succinate (TOPROL-XL) 25 MG 24 hr tablet    Sig: Take 1 tablet (25 mg total) by mouth daily.    Dispense:  90 tablet    Refill:  3  . aspirin 81 MG EC tablet    Sig: Take 1 tablet (81 mg total) by mouth daily.   Dispense:  90 tablet    Refill:  3  . losartan (COZAAR) 25 MG tablet    Sig: Take 1 tablet (25 mg total) by mouth daily. P    Dispense:  90 tablet    Refill:  3  . pravastatin (PRAVACHOL) 40 MG tablet    Sig: Take 1 tablet (40 mg total) by mouth daily.    Dispense:  90 tablet    Refill:  3  . betamethasone dipropionate (DIPROLENE) 0.05 % cream    Sig: Apply topically 2 (two) times daily. To affected area(s) as needed fo rup to two weeks    Dispense:  45 g    Refill:  3    Patient Instructions   Recommend Co-Q-10 supplementation 30 - 250 mg per day along with statin to treat statin-related muscle aches and fatigue   [facility]!    Follow-up plan: Return in about 6 months (around 03/01/2019) for ANNUAL PHYSICAL - sooner if needed .                                 ############################################ ############################################ ############################################ ############################################    Outpatient Encounter Medications as of 08/31/2018  Medication Sig Note  . aspirin 81 MG EC tablet TAKE 1 TABLET (81 MG TOTAL) BY MOUTH DAILY. SWALLOW WHOLE. PATIENT MUST KEEP APPT W/PCP.   Marland Kitchen losartan (COZAAR) 25 MG tablet Take 1 tablet (25 mg  total) by mouth daily. Patient must keep upcoming appt   . metoprolol succinate (TOPROL-XL) 25 MG 24 hr tablet Take 1 tablet (25 mg total) by mouth daily. Pt must keep upcoming appt   . sildenafil (REVATIO) 20 MG tablet Take 1-5 tablets (20-100 mg total) by mouth as needed (take prior to sex. Use lowest effective dose). DO NOT TAKE WITH NITRATES   . atorvastatin (LIPITOR) 80 MG tablet TAKE 1 TABLET BY MOUTH DAILY. PATIENT MUST COMPLETED LABS. (Patient not taking: Reported on 08/31/2018) 08/31/2018: As per pt - stopped taking medication x 8 mths ago  . betamethasone dipropionate (DIPROLENE) 0.05 % cream Apply topically 2 (two) times daily. To affected area(s) as  needed fo rup to two weeks (Patient not taking: Reported on 08/31/2018)    No facility-administered encounter medications on file as of 08/31/2018.    No Known Allergies    Review of Systems:  Constitutional: No recent illness  HEENT: No  headache, no vision change  Cardiac: No  chest pain, No  pressure, No palpitations  Respiratory:  No  shortness of breath. No  Cough  Gastrointestinal: No  abdominal pain, no change on bowel habits  Musculoskeletal: No new myalgia/arthralgia  Skin: +Rash  Neurologic: No  weakness, No  Dizziness  Psychiatric: +concerns with depression - normal grief, No  concerns with anxiety  Exam:  BP 125/78 (BP Location: Left Arm, Patient Position: Sitting, Cuff Size: Large)   Pulse 60   Temp 98.6 F (37 C) (Oral)   Wt (!) 308 lb 11.2 oz (140 kg)   BMI 39.63 kg/m   Constitutional: VS see above. General Appearance: alert, well-developed, well-nourished, NAD  Eyes: Normal lids and conjunctive, non-icteric sclera  Ears, Nose, Mouth, Throat: MMM, Normal external inspection ears/nares/mouth/lips/gums.  Neck: No masses, trachea midline.   Respiratory: Normal respiratory effort. no wheeze, no rhonchi, no rales  Cardiovascular: S1/S2 normal, no murmur, no rub/gallop auscultated. RRR.   Musculoskeletal: Gait normal. Symmetric and independent movement of all extremities  Neurological: Normal balance/coordination. No tremor.  Skin: warm, dry, intact.   Psychiatric: Normal judgment/insight. Normal mood and affect. Oriented x3.   Visit summary with medication list and pertinent instructions was printed for patient to review, advised to alert [place] if any changes needed. All questions at time of visit were answered - patient instructed to contact office with any additional concerns. ER/RTC precautions were reviewed with the patient and understanding verbalized.   Follow-up plan: Return in about 6 months (around 03/01/2019) for ANNUAL PHYSICAL - sooner if  needed .  Note: Total time spent 15 minutes, greater than 50% of the visit was spent face-to-face counseling and coordinating care for the following: Diagnoses of Hx of CABG, HTN (hypertension), benign, and Plaque psoriasis were pertinent to this visit.Marland Kitchen  Please note: voice recognition software was used to produce this document, and typos may escape review. Please contact Dr. Lyn Hollingshead for any needed clarifications.

## 2018-08-31 NOTE — Patient Instructions (Addendum)
   Recommend Co-Q-10 supplementation 30 - 250 mg per day along with statin to treat statin-related muscle aches and fatigue   [facility]!

## 2018-10-02 ENCOUNTER — Telehealth: Payer: Self-pay

## 2018-10-02 NOTE — Telephone Encounter (Addendum)
Pt left a vm msg stating he is having issues with his cholesterol med. He stopped taking the med 4 days ago because of having extreme pain in his knees. Pt stated that this happen before & cholesterol med was changed. As per pt, pain is minimized if he is continues walking about. He is requesting feed back from provider.

## 2018-10-05 ENCOUNTER — Ambulatory Visit (INDEPENDENT_AMBULATORY_CARE_PROVIDER_SITE_OTHER): Payer: Self-pay | Admitting: Physician Assistant

## 2018-10-05 ENCOUNTER — Ambulatory Visit (INDEPENDENT_AMBULATORY_CARE_PROVIDER_SITE_OTHER): Payer: Self-pay

## 2018-10-05 ENCOUNTER — Encounter: Payer: Self-pay | Admitting: Physician Assistant

## 2018-10-05 VITALS — BP 155/93 | HR 67 | Ht 74.0 in | Wt 314.0 lb

## 2018-10-05 DIAGNOSIS — M170 Bilateral primary osteoarthritis of knee: Secondary | ICD-10-CM

## 2018-10-05 DIAGNOSIS — G4733 Obstructive sleep apnea (adult) (pediatric): Secondary | ICD-10-CM

## 2018-10-05 DIAGNOSIS — M25561 Pain in right knee: Secondary | ICD-10-CM

## 2018-10-05 DIAGNOSIS — I10 Essential (primary) hypertension: Secondary | ICD-10-CM

## 2018-10-05 MED ORDER — DICLOFENAC SODIUM 1 % TD GEL: 4.0000 g | Freq: Four times a day (QID) | TRANSDERMAL | 1 refills | Status: DC

## 2018-10-05 NOTE — Telephone Encounter (Signed)
Left pt a detailed vm msg regarding provider's note. Direct call back info provided.  

## 2018-10-05 NOTE — Telephone Encounter (Signed)
Pravastatin is 1 of the milder cholesterol medications.  He should either try over-the-counter supplementation with co-Q10, but I am more concerned about a possible orthopedic problem rather than medication side effects.  Would consider evaluation with sports medicine

## 2018-10-05 NOTE — Patient Instructions (Addendum)
Ibuprofen 800mg  up to three times a day as needed.   Saxenda(injection)  Belviq(pill)  Contrave(pill)

## 2018-10-05 NOTE — Progress Notes (Signed)
Subjective:    Patient ID: Jon Mcbride, male    DOB: 08-26-1961, 57 y.o.   MRN: [id number]  HPI  Pt is a 57 yo obese male with hx of CAD, stent placement, HTN, OSA who presents to the clinic with right knee pain intermittently but increased over the past 2-3 weeks. He just started a new job last week that seemed to make pain much worse. He does not remember any injury but was on his feet for at least 12 hours that day. He works as a Production designer, theatre/television/film man. He does admit to gaining 30lbs in the last year. Both parents passed away and it has been hard on him. He does have a hx of gout but denies any warmth coming from knee.   .. Active Ambulatory Problems    Diagnosis Date Noted  . HTN (hypertension), benign 01/01/2013  . Stented coronary artery 01/01/2013  . 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 Obstructive sleep apnea 02/26/2018  . Morbid obesity (HCC) 10/06/2018  . Acute pain of right knee 10/06/2018   Resolved Ambulatory Problems    Diagnosis Date Noted  . No Resolved Ambulatory Problems   Past Medical History:  Diagnosis Date  . Gout   . Heart attack (HCC)   . Hypertension       Review of Systems  All other systems reviewed and are negative.      Objective:   Physical Exam  Constitutional: He is oriented to person, place, and time. He appears well-developed and well-nourished.  Obese.   HENT:  Head: Normocephalic and atraumatic.  Cardiovascular: Normal rate and regular rhythm.  Pulmonary/Chest: Effort normal and breath sounds normal.  Musculoskeletal:  Right knee: No warmth, redness of right knee.  Tenderness over medial joint space.  No significant edema.   Neurological: He is alert and oriented to person, place, and time.  Psychiatric: He has a normal mood and affect. His behavior is normal.          Assessment & Plan:  Marland KitchenMarland KitchenDiagnoses and all orders for this visit:  Acute pain of right knee -     diclofenac sodium (VOLTAREN) 1 % GEL; Apply 4 g topically  4 (four) times daily. To affected joint. -     DG Knee 4 Views W/Patella Left -     DG Knee 4 Views W/Patella Right  Morbid obesity (HCC)  Right Knee IMPRESSION: 1. Marked patellofemoral joint degenerative changes. 2. Mild medial tibiofemoral joint space narrowing. 3. Minimal lateral tibiofemoral joint degenerative changes. 4. Evidence of prior [employer] disease.  Left Knee IMPRESSION: 1. Marked medial tibiofemoral joint degenerative changes/joint space narrowing. 2. Moderate patellofemoral joint degenerative changes. 3. Mild lateral tibiofemoral joint degenerative changes. 4. Well corticated ossific structure tibial tuberosity region may be related to prior [employer] disease.  Given diclofenac gel. He was told to avoid oral NSAIDs after stent. Ok for short term. Discussed 800mg  TID. Need to work on weight loss. Discussed medication options. I do not think with prior cardiac hx phentermine would be the best choice at this time.discuss with PCP options for weight loss.  Follow up with sports medicine for consideration of injections. Ice knee regularly. Wear knee sleeve for support. Work on Water engineer and ligaments around knee.(given HO).  Written out of work through Thursday to rest. Return to work on Friday. He has a very physical job he may need to reconsider.   Marland Kitchen.Spent 30 minutes with patient and greater than 50 percent of visit spent  counseling patient regarding treatment plan.

## 2018-10-06 ENCOUNTER — Encounter: Payer: Self-pay | Admitting: Physician Assistant

## 2018-10-06 DIAGNOSIS — M25561 Pain in right knee: Secondary | ICD-10-CM | POA: Insufficient documentation

## 2018-10-06 HISTORY — DX: Morbid (severe) obesity due to excess calories: E66.01

## 2018-10-06 NOTE — Progress Notes (Signed)
Call pt: arthritis in both knees. Needs appt with Dr. Corey/Dr. T to consider injections.

## 2018-10-07 DIAGNOSIS — M170 Bilateral primary osteoarthritis of knee: Secondary | ICD-10-CM | POA: Insufficient documentation

## 2018-10-07 HISTORY — DX: Bilateral primary osteoarthritis of knee: M17.0

## 2018-10-09 ENCOUNTER — Ambulatory Visit (INDEPENDENT_AMBULATORY_CARE_PROVIDER_SITE_OTHER): Payer: Self-pay | Admitting: Sports Medicine

## 2018-10-09 ENCOUNTER — Encounter: Payer: Self-pay | Admitting: Sports Medicine

## 2018-10-09 DIAGNOSIS — M170 Bilateral primary osteoarthritis of knee: Secondary | ICD-10-CM

## 2018-10-09 MED ORDER — MELOXICAM 15 MG PO TABS
ORAL_TABLET | ORAL | 3 refills | Status: DC
Start: 2018-10-09 — End: 2019-07-09

## 2018-10-09 NOTE — Patient Instructions (Signed)
Look into Saxenda, Carpenter, Contrave

## 2018-10-09 NOTE — Progress Notes (Signed)
Subjective:    CC: Bilateral knee pain  HPI: Jon Mcbride is a pleasant 57 year old male, for years he said pain in both knees, anterior, suprapatellar recess, medial joint line, no mechanical symptoms, moderate, persistent without radiation.  I reviewed the past medical history, family history, social history, surgical history, and allergies today and no changes were needed.  Please see the problem list section below in epic for further details.  Past Medical History: Past Medical History:  Diagnosis Date  . Gout   . Heart attack (HCC)   . Hypertension    Past Surgical History: Past Surgical History:  Procedure Laterality Date  . CAROTID STENT INSERTION     Social History: Social History   Socioeconomic History  . Marital status: Divorced    Spouse name: Not on file  . Number of children: Not on file  . Years of education: Not on file  . Highest education level: Not on file  Occupational History  . Not on file  Social Needs  . Financial resource strain: Not on file  . Food insecurity:    Worry: Not on file    Inability: Not on file  . Transportation needs:    Medical: Not on file    Non-medical: Not on file  Tobacco Use  . Smoking status: Never Smoker  . Smokeless tobacco: Never Used  Substance and Sexual Activity  . Alcohol use: No  . Drug use: No  . Sexual activity: Not on file  Lifestyle  . Physical activity:    Days per week: Not on file    Minutes per session: Not on file  . Stress: Not on file  Relationships  . Social connections:    Talks on phone: Not on file    Gets together: Not on file    Attends religious service: Not on file    Active member of club or organization: Not on file    Attends meetings of clubs or organizations: Not on file    Relationship status: Not on file  Other Topics Concern  . Not on file  Social History Narrative  . Not on file   Family History: Family History  Problem Relation Age of Onset  . Hypertension Father   .  Hyperlipidemia Father   . Cancer Mother   . Hyperlipidemia Mother    Allergies: No Known Allergies Medications: See med rec.  Review of Systems: No fevers, chills, night sweats, weight loss, chest pain, or shortness of breath.   Objective:    General: Well Developed, well nourished, and in no acute distress.  Neuro: Alert and oriented x3, extra-ocular muscles intact, sensation grossly intact.  HEENT: Normocephalic, atraumatic, pupils equal round reactive to light, neck supple, no masses, no lymphadenopathy, thyroid nonpalpable.  Skin: Warm and dry, no rashes. Cardiac: Regular rate and rhythm, no murmurs rubs or gallops, no lower extremity edema.  Respiratory: Clear to auscultation bilaterally. Not using accessory muscles, speaking in full sentences. Bilateral knees: Slightly swollen bilaterally. Tender palpation over the patellar facets and medial joint line bilaterally ROM normal in flexion and extension and lower leg rotation. Ligaments with solid consistent endpoints including ACL, PCL, LCL, MCL. Negative Mcmurray's and provocative meniscal tests. Non painful patellar compression. Patellar and quadriceps tendons unremarkable. Hamstring and quadriceps strength is normal.  Procedure: Real-time Ultrasound Guided Injection of left knee Device: GE Logiq E  Verbal informed consent obtained.  Time-out conducted.  Noted no overlying erythema, induration, or other signs of local infection.  Skin prepped in  a sterile fashion.  Local anesthesia: Topical Ethyl chloride.  With sterile technique and under real time ultrasound guidance: 1 cc Kenalog 40, 2 cc lidocaine, 2 cc bupivacaine injected easily Completed without difficulty  Pain immediately resolved suggesting accurate placement of the medication.  Advised to call if fevers/chills, erythema, induration, drainage, or persistent bleeding.  Images permanently stored and available for review in the ultrasound unit.  Impression:  Technically successful ultrasound guided injection.  Procedure: Real-time Ultrasound Guided Injection of right knee Device: GE Logiq E  Verbal informed consent obtained.  Time-out conducted.  Noted no overlying erythema, induration, or other signs of local infection.  Skin prepped in a sterile fashion.  Local anesthesia: Topical Ethyl chloride.  With sterile technique and under real time ultrasound guidance: 1 cc Kenalog 40, 2 cc lidocaine, 2 cc bupivacaine injected easily Completed without difficulty  Pain immediately resolved suggesting accurate placement of the medication.  Advised to call if fevers/chills, erythema, induration, drainage, or persistent bleeding.  Images permanently stored and available for review in the ultrasound unit.  Impression: Technically successful ultrasound guided injection.  Impression and Recommendations:    Bilateral primary osteoarthritis of knee Bilateral injections, aspiration on the right. Meloxicam, home rehab exercises. He is working aggressively on weight loss with his PCP. Return to see me in 1 month. ___________________________________________ Ihor Austin. Benjamin Stain, M.D., ABFM., CAQSM. Primary Care and Sports Medicine [facility]  Adjunct Professor of Family Medicine  [employer]

## 2018-10-09 NOTE — Assessment & Plan Note (Signed)
Bilateral injections, aspiration on the right. Meloxicam, home rehab exercises. He is working aggressively on weight loss with his PCP. Return to see me in 1 month.

## 2018-10-19 ENCOUNTER — Encounter: Payer: Self-pay | Admitting: Sports Medicine

## 2018-10-19 ENCOUNTER — Ambulatory Visit (INDEPENDENT_AMBULATORY_CARE_PROVIDER_SITE_OTHER): Payer: Self-pay | Admitting: Sports Medicine

## 2018-10-19 DIAGNOSIS — M170 Bilateral primary osteoarthritis of knee: Secondary | ICD-10-CM

## 2018-10-19 NOTE — Assessment & Plan Note (Addendum)
Continues to do okay after aspiration and injection at the last visit. He did stop his meloxicam and some of the pain came back. He will restart Mobic, continue with rehab exercises, if insufficient relief we will proceed with MRI of the right knee, and likely Visco supplementation.   He gets insurance in January so we will try a relatively conservative approach for now.

## 2018-10-19 NOTE — Progress Notes (Signed)
Subjective:    CC: Right knee pain  HPI: This is a pleasant 57 year old male, I saw him a week ago, we did bilateral knee injections, left knee is doing well, right knee has some pain medially.  Mild, with significant gelling.  He was doing well until he stopped taking his meloxicam.  I reviewed the past medical history, family history, social history, surgical history, and allergies today and no changes were needed.  Please see the problem list section below in epic for further details.  Past Medical History: Past Medical History:  Diagnosis Date  . Gout   . Heart attack (HCC)   . Hypertension    Past Surgical History: Past Surgical History:  Procedure Laterality Date  . CAROTID STENT INSERTION     Social History: Social History   Socioeconomic History  . Marital status: Divorced    Spouse name: Not on file  . Number of children: Not on file  . Years of education: Not on file  . Highest education level: Not on file  Occupational History  . Not on file  Social Needs  . Financial resource strain: Not on file  . Food insecurity:    Worry: Not on file    Inability: Not on file  . Transportation needs:    Medical: Not on file    Non-medical: Not on file  Tobacco Use  . Smoking status: Never Smoker  . Smokeless tobacco: Never Used  Substance and Sexual Activity  . Alcohol use: No  . Drug use: No  . Sexual activity: Not on file  Lifestyle  . Physical activity:    Days per week: Not on file    Minutes per session: Not on file  . Stress: Not on file  Relationships  . Social connections:    Talks on phone: Not on file    Gets together: Not on file    Attends religious service: Not on file    Active member of club or organization: Not on file    Attends meetings of clubs or organizations: Not on file    Relationship status: Not on file  Other Topics Concern  . Not on file  Social History Narrative  . Not on file   Family History: Family History  Problem  Relation Age of Onset  . Hypertension Father   . Hyperlipidemia Father   . Cancer Mother   . Hyperlipidemia Mother    Allergies: No Known Allergies Medications: See med rec.  Review of Systems: No fevers, chills, night sweats, weight loss, chest pain, or shortness of breath.   Objective:    General: Well Developed, well nourished, and in no acute distress.  Neuro: Alert and oriented x3, extra-ocular muscles intact, sensation grossly intact.  HEENT: Normocephalic, atraumatic, pupils equal round reactive to light, neck supple, no masses, no lymphadenopathy, thyroid nonpalpable.  Skin: Warm and dry, no rashes. Cardiac: Regular rate and rhythm, no murmurs rubs or gallops, no lower extremity edema.  Respiratory: Clear to auscultation bilaterally. Not using accessory muscles, speaking in full sentences. Right knee: Normal to inspection with no erythema or effusion or obvious bony abnormalities. Palpation normal with no warmth or joint line tenderness or patellar tenderness or condyle tenderness. ROM normal in flexion and extension and lower leg rotation. Ligaments with solid consistent endpoints including ACL, PCL, LCL, MCL. Negative Mcmurray's and provocative meniscal tests. Non painful patellar compression. Patellar and quadriceps tendons unremarkable. Hamstring and quadriceps strength is normal.  Impression and Recommendations:  Bilateral primary osteoarthritis of knee Continues to do okay after aspiration and injection at the last visit. He did stop his meloxicam and some of the pain came back. He will restart Mobic, continue with rehab exercises, if insufficient relief we will proceed with MRI of the right knee, and likely Visco supplementation.   He gets insurance in January so we will try a relatively conservative approach for now. ___________________________________________ Ihor Austin. Benjamin Stain, M.D., ABFM., CAQSM. Primary Care and Sports Medicine [facility]  [facility]  Adjunct Professor of Family Medicine  [employer]

## 2018-11-06 ENCOUNTER — Ambulatory Visit (INDEPENDENT_AMBULATORY_CARE_PROVIDER_SITE_OTHER): Payer: Self-pay | Admitting: Sports Medicine

## 2018-11-06 DIAGNOSIS — L409 Psoriasis, unspecified: Secondary | ICD-10-CM

## 2018-11-06 DIAGNOSIS — M170 Bilateral primary osteoarthritis of knee: Secondary | ICD-10-CM

## 2018-11-06 HISTORY — DX: Psoriasis, unspecified: L40.9

## 2018-11-06 MED ORDER — CLOBETASOL PROPIONATE 0.05 % EX SOLN: 1.0000 "application " | Freq: Two times a day (BID) | CUTANEOUS | 0 refills | Status: DC

## 2018-11-06 MED ORDER — ACETAMINOPHEN ER 650 MG PO TBCR: 650.0000 mg | EXTENDED_RELEASE_TABLET | Freq: Three times a day (TID) | ORAL | 3 refills | Status: DC

## 2018-11-06 MED ORDER — TRAMADOL HCL 50 MG PO TABS: 50.0000 mg | ORAL_TABLET | Freq: Three times a day (TID) | ORAL | 0 refills | Status: DC | PRN

## 2018-11-06 NOTE — Assessment & Plan Note (Signed)
Left knee continues to do well after injection a month and a half ago, right knee still hurting, repeat injection today. Tylenol arthritis 1 3 times daily, adding tramadol for breakthrough pain. We did discuss bariatric surgery referral for sleeve gastrectomy, he will consider this when he gets insurance next year.

## 2018-11-06 NOTE — Progress Notes (Signed)
Subjective:    CC: Right knee pain  HPI: Jon Mcbride returns, he has bilateral osteoarthritis, bone-on-bone on the left, left knee injection provided complete relief, continues to have pain in the right.  Medial joint line, no mechanical symptoms or trauma.  I reviewed the past medical history, family history, social history, surgical history, and allergies today and no changes were needed.  Please see the problem list section below in epic for further details.  Past Medical History: Past Medical History:  Diagnosis Date  . Gout   . Heart attack (HCC)   . Hypertension    Past Surgical History: Past Surgical History:  Procedure Laterality Date  . CAROTID STENT INSERTION     Social History: Social History   Socioeconomic History  . Marital status: Divorced    Spouse name: Not on file  . Number of children: Not on file  . Years of education: Not on file  . Highest education level: Not on file  Occupational History  . Not on file  Social Needs  . Financial resource strain: Not on file  . Food insecurity:    Worry: Not on file    Inability: Not on file  . Transportation needs:    Medical: Not on file    Non-medical: Not on file  Tobacco Use  . Smoking status: Never Smoker  . Smokeless tobacco: Never Used  Substance and Sexual Activity  . Alcohol use: No  . Drug use: No  . Sexual activity: Not on file  Lifestyle  . Physical activity:    Days per week: Not on file    Minutes per session: Not on file  . Stress: Not on file  Relationships  . Social connections:    Talks on phone: Not on file    Gets together: Not on file    Attends religious service: Not on file    Active member of club or organization: Not on file    Attends meetings of clubs or organizations: Not on file    Relationship status: Not on file  Other Topics Concern  . Not on file  Social History Narrative  . Not on file   Family History: Family History  Problem Relation Age of Onset  .  Hypertension Father   . Hyperlipidemia Father   . Cancer Mother   . Hyperlipidemia Mother    Allergies: No Known Allergies Medications: See med rec.  Review of Systems: No fevers, chills, night sweats, weight loss, chest pain, or shortness of breath.   Objective:    General: Well Developed, well nourished, and in no acute distress.  Neuro: Alert and oriented x3, extra-ocular muscles intact, sensation grossly intact.  HEENT: Normocephalic, atraumatic, pupils equal round reactive to light, neck supple, no masses, no lymphadenopathy, thyroid nonpalpable.  Skin: Warm and dry, no rashes. Cardiac: Regular rate and rhythm, no murmurs rubs or gallops, no lower extremity edema.  Respiratory: Clear to auscultation bilaterally. Not using accessory muscles, speaking in full sentences. Right knee: Normal to inspection with no erythema or effusion or obvious bony abnormalities. Minimal tenderness at the medial joint line ROM normal in flexion and extension and lower leg rotation. Ligaments with solid consistent endpoints including ACL, PCL, LCL, MCL. Negative Mcmurray's and provocative meniscal tests. Non painful patellar compression. Patellar and quadriceps tendons unremarkable. Hamstring and quadriceps strength is normal.  Procedure: Real-time Ultrasound Guided Injection of right knee Device: GE Logiq E  Verbal informed consent obtained.  Time-out conducted.  Noted no overlying erythema, induration,  or other signs of local infection.  Skin prepped in a sterile fashion.  Local anesthesia: Topical Ethyl chloride.  With sterile technique and under real time ultrasound guidance: 1 cc Kenalog 40, 2 cc lidocaine, 2 cc bupivacaine injected easily. Completed without difficulty  Pain immediately resolved suggesting accurate placement of the medication.  Advised to call if fevers/chills, erythema, induration, drainage, or persistent bleeding.  Images permanently stored and available for review in  the ultrasound unit.  Impression: Technically successful ultrasound guided injection.  Impression and Recommendations:    Bilateral primary osteoarthritis of knee Left knee continues to do well after injection a month and a half ago, right knee still hurting, repeat injection today. Tylenol arthritis 1 3 times daily, adding tramadol for breakthrough pain. We did discuss bariatric surgery referral for sleeve gastrectomy, he will consider this when he gets insurance next year.  Psoriasis Adding clobetasol spray. Other options include calcipotriene, coal tar. ___________________________________________ Jon Mcbride. Jon Mcbride, M.D., ABFM., CAQSM. Primary Care and Sports Medicine [facility]  Adjunct Professor of Family Medicine  [employer]

## 2018-11-06 NOTE — Assessment & Plan Note (Addendum)
Adding clobetasol spray. Other options include calcipotriene, coal tar.

## 2018-11-23 ENCOUNTER — Other Ambulatory Visit: Payer: Self-pay | Admitting: Physician Assistant

## 2018-11-23 DIAGNOSIS — M25561 Pain in right knee: Secondary | ICD-10-CM

## 2019-01-18 ENCOUNTER — Other Ambulatory Visit: Payer: Self-pay | Admitting: Physician Assistant

## 2019-01-18 DIAGNOSIS — M25561 Pain in right knee: Secondary | ICD-10-CM

## 2019-02-02 ENCOUNTER — Other Ambulatory Visit: Payer: Self-pay | Admitting: Sports Medicine

## 2019-02-02 DIAGNOSIS — L409 Psoriasis, unspecified: Secondary | ICD-10-CM

## 2019-02-18 ENCOUNTER — Telehealth: Payer: Self-pay

## 2019-02-18 NOTE — Telephone Encounter (Signed)
[facility]:   "Pt has been on losartan 25 mg. All strengths of this medication is on a long term back order. Would provider like patient to switch to something else?". Pls advise.

## 2019-02-19 MED ORDER — IRBESARTAN 75 MG PO TABS: 75.0000 mg | ORAL_TABLET | Freq: Every day | ORAL | 0 refills | Status: DC

## 2019-02-19 NOTE — Telephone Encounter (Signed)
Alternative sent, patient will need to come into the clinic for a nurse visit 2 weeks after starting new medication to ensure that blood pressures are still at goal   BP Readings from Last 3 Encounters:  11/06/18 126/84  10/19/18 127/81  10/09/18 116/75

## 2019-02-19 NOTE — Telephone Encounter (Signed)
Pt has been updated and ok with alternative medication. Transferred to front desk to schedule NV for bp check in 2 wks. No other inquiries during call.

## 2019-03-01 ENCOUNTER — Telehealth: Payer: Self-pay | Admitting: Osteopathic Medicine

## 2019-03-01 ENCOUNTER — Encounter: Payer: Self-pay | Admitting: Osteopathic Medicine

## 2019-03-01 ENCOUNTER — Other Ambulatory Visit: Payer: Self-pay

## 2019-03-01 ENCOUNTER — Ambulatory Visit (INDEPENDENT_AMBULATORY_CARE_PROVIDER_SITE_OTHER): Payer: Managed Care, Other (non HMO) | Admitting: Osteopathic Medicine

## 2019-03-01 VITALS — BP 120/78 | HR 69 | Temp 98.2°F | Wt 299.1 lb

## 2019-03-01 DIAGNOSIS — Z1211 Encounter for screening for malignant neoplasm of colon: Secondary | ICD-10-CM

## 2019-03-01 DIAGNOSIS — I10 Essential (primary) hypertension: Secondary | ICD-10-CM | POA: Diagnosis not present

## 2019-03-01 DIAGNOSIS — G4733 Obstructive sleep apnea (adult) (pediatric): Secondary | ICD-10-CM

## 2019-03-01 DIAGNOSIS — Z951 Presence of aortocoronary bypass graft: Secondary | ICD-10-CM

## 2019-03-01 DIAGNOSIS — L409 Psoriasis, unspecified: Secondary | ICD-10-CM | POA: Diagnosis not present

## 2019-03-01 DIAGNOSIS — Z0000 Encounter for general adult medical examination without abnormal findings: Secondary | ICD-10-CM

## 2019-03-01 NOTE — Telephone Encounter (Signed)
-----   Message from Sunnie Nielsen, DO sent at 03/01/2019  9:59 AM EDT ----- Regarding: shingrix Shingles shotssss list thanks

## 2019-03-01 NOTE — Patient Instructions (Signed)
General Preventive Care  Most recent routine screening lipids/other labs: ordered today.   Blood pressure: goal 130/80  Tobacco: don't!   Alcohol: responsible moderation is ok for most adults - if you have concerns about your alcohol intake, please talk to me!   Exercise: as tolerated to reduce risk of cardiovascular disease and diabetes. Strength training will also prevent osteoporosis.   Mental health: if need for mental health care (medicines, counseling, other), or concerns about moods, please let me know!   Sexual health: if need for STD testing, or if concerns with libido/pain problems, please let me know!   Advanced Directive: Living Will and/or Healthcare Power of Attorney recommended for all adults, regardless of age or health.  Vaccines  Flu vaccine: recommended for almost everyone, every fall.   Shingles vaccine: Shingrix recommended after age 59.   Pneumonia vaccines: Prevnar and Pneumovax recommended after age 1, or sooner if certain medical conditions.  Tetanus booster: Tdap recommended every 10 years. Due 12/2022 Cancer screenings   Colon cancer screening: recommended for everyone at age 71, but some folks need a colonoscopy sooner if risk factors   Prostate cancer screening: PSA blood test age 59-71  Lung cancer screening: not needed for non-smokers Infection screenings . HIV, Gonorrhea/Chlamydia: screening as needed . Hepatitis C: recommended for anyone born 31-1966. Done 03/2018 . TB: certain at-risk populations, or depending on work requirements and/or travel history Other . Bone Density Test: recommended for men at age 73, sooner depending on risk factors

## 2019-03-01 NOTE — Progress Notes (Signed)
HPI: Jon Mcbride is a 58 y.o. male who  has a past medical history of Gout, Heart attack (HCC), and Hypertension.  he presents to [facility] today, 03/01/19,  for chief complaint of: Annual physical     Patient here for annual physical / wellness exam.  See preventive care reviewed as below.   Additional concerns today include:  Working on [employer] loss Following w/ sports med for knee pain  Psoriasis worse, meds not helping even clobetasol     Past medical, surgical, social and family history reviewed:  Patient Active Problem List   Diagnosis Date Noted  . Psoriasis 11/06/2018  . Bilateral primary osteoarthritis of knee 10/07/2018  . Morbid obesity (HCC) 10/06/2018  . Obstructive sleep apnea 02/26/2018  . 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 HTN (hypertension), benign 01/01/2013  . Stented coronary artery 01/01/2013    Past Surgical History:  Procedure Laterality Date  . CAROTID STENT INSERTION      Social History   Tobacco Use  . Smoking status: Never Smoker  . Smokeless tobacco: Never Used  Substance Use Topics  . Alcohol use: No    Family History  Problem Relation Age of Onset  . Hypertension Father   . Hyperlipidemia Father   . Cancer Mother   . Hyperlipidemia Mother      Current medication list and allergy/intolerance information reviewed:    Current Outpatient Medications  Medication Sig Dispense Refill  . acetaminophen (TYLENOL) 650 MG CR tablet Take 1 tablet (650 mg total) by mouth every 8 (eight) hours. 90 tablet 3  . aspirin 81 MG EC tablet Take 1 tablet (81 mg total) by mouth daily. 90 tablet 3  . betamethasone dipropionate (DIPROLENE) 0.05 % cream Apply topically 2 (two) times daily. To affected area(s) as needed fo rup to two weeks 45 g 3  . clobetasol (TEMOVATE) 0.05 % external solution APPLY 1 APPLICATION TOPICALLY 2 (TWO) TIMES DAILY. SPRAY FORMULATION 50 mL 0  . diclofenac sodium (VOLTAREN) 1 %  GEL APPLY 4 G TOPICALLY 4 (FOUR) TIMES DAILY. TO AFFECTED JOINT. 100 g 1  . irbesartan (AVAPRO) 75 MG tablet Take 1 tablet (75 mg total) by mouth daily. 90 tablet 0  . meloxicam (MOBIC) 15 MG tablet One tab PO qAM with breakfast for 2 weeks, then daily prn pain. 30 tablet 3  . metoprolol succinate (TOPROL-XL) 25 MG 24 hr tablet Take 1 tablet (25 mg total) by mouth daily. 90 tablet 3  . pravastatin (PRAVACHOL) 40 MG tablet Take 1 tablet (40 mg total) by mouth daily. 90 tablet 3  . sildenafil (REVATIO) 20 MG tablet Take 1-5 tablets (20-100 mg total) by mouth as needed (take prior to sex. Use lowest effective dose). DO NOT TAKE WITH NITRATES 50 tablet 3  . traMADol (ULTRAM) 50 MG tablet Take 1 tablet (50 mg total) by mouth every 8 (eight) hours as needed for moderate pain. Maximum 6 tabs per day. 21 tablet 0   No current facility-administered medications for this visit.     No Known Allergies    Review of Systems:  Constitutional:  No  fever, no chills, No recent illness, No unintentional weight changes. No significant fatigue.   HEENT: No  headache, no vision change, no hearing change, No sore throat, No  sinus pressure  Cardiac: No  chest pain, No  pressure, No palpitations, No  Orthopnea  Respiratory:  No  shortness of breath. No  Cough  Gastrointestinal: No  abdominal pain, No  nausea, No  vomiting,  No  blood in stool, No  diarrhea, No  constipation   Musculoskeletal: No new myalgia/arthralgia  Skin: No  Rash, No other wounds/concerning lesions  Genitourinary: No  incontinence, No  abnormal genital bleeding, No abnormal genital discharge  Hem/Onc: No  easy bruising/bleeding, No  abnormal lymph node  Endocrine: No cold intolerance,  No heat intolerance. No polyuria/polydipsia/polyphagia   Neurologic: No  weakness, No  dizziness, No  slurred speech/focal weakness/facial droop  Psychiatric: No  concerns with depression, No  concerns with anxiety, No sleep problems, No mood  problems  Exam:  BP 120/78 (BP Location: Left Arm, Patient Position: Sitting, Cuff Size: Large)   Pulse 69   Temp 98.2 F (36.8 C) (Oral)   Wt 299 lb 1.6 oz (135.7 kg)   BMI 38.40 kg/m   Constitutional: VS see above. General Appearance: alert, well-developed, well-nourished, NAD  Eyes: Normal lids and conjunctive, non-icteric sclera  Ears, Nose, Mouth, Throat: MMM, Normal external inspection ears/nares/mouth/lips/gums. TM normal bilaterally. Pharynx/tonsils no erythema, no exudate. Nasal mucosa normal.   Neck: No masses, trachea midline. No thyroid enlargement. No tenderness/mass appreciated. No lymphadenopathy  Respiratory: Normal respiratory effort. no wheeze, no rhonchi, no rales  Cardiovascular: S1/S2 normal, no murmur, no rub/gallop auscultated. RRR. No lower extremity edema.   Gastrointestinal: Nontender, no masses. No hepatomegaly, no splenomegaly. No hernia appreciated. Bowel sounds normal. Rectal exam deferred.   Musculoskeletal: Gait normal. No clubbing/cyanosis of digits.   Neurological: Normal balance/coordination. No tremor. No cranial nerve deficit on limited exam. Motor and sensation intact and symmetric. Cerebellar reflexes intact.   Skin: warm, dry, intact, +psoriasis.    Psychiatric: Normal judgment/insight. Normal mood and affect. Oriented x3.       ASSESSMENT/PLAN: The primary encounter diagnosis was Annual physical exam. Diagnoses of HTN (hypertension), benign, Obstructive sleep apnea, Psoriasis, History of quadruple bypass, and Colon cancer screening were also pertinent to this visit.   Orders Placed This Encounter  Procedures  . CBC  . COMPLETE METABOLIC PANEL WITH GFR  . Lipid panel  . PSA, Total with Reflex to PSA, Free  . Cologuard  . Ambulatory referral to Dermatology     Patient Instructions  General Preventive Care  Most recent routine screening lipids/other labs: ordered today.   Blood pressure: goal 130/80  Tobacco: don't!    Alcohol: responsible moderation is ok for most adults - if you have concerns about your alcohol intake, please talk to me!   Exercise: as tolerated to reduce risk of cardiovascular disease and diabetes. Strength training will also prevent osteoporosis.   Mental health: if need for mental health care (medicines, counseling, other), or concerns about moods, please let me know!   Sexual health: if need for STD testing, or if concerns with libido/pain problems, please let me know!   Advanced Directive: Living Will and/or Healthcare Power of Attorney recommended for all adults, regardless of age or health.  Vaccines  Flu vaccine: recommended for almost everyone, every fall.   Shingles vaccine: Shingrix recommended after age 57.   Pneumonia vaccines: Prevnar and Pneumovax recommended after age 61, or sooner if certain medical conditions.  Tetanus booster: Tdap recommended every 10 years. Due 12/2022 Cancer screenings   Colon cancer screening: recommended for everyone at age 74, but some folks need a colonoscopy sooner if risk factors   Prostate cancer screening: PSA blood test age 41-71  Lung cancer screening: not needed for non-smokers Infection screenings . HIV, Gonorrhea/Chlamydia: screening  as needed . Hepatitis C: recommended for anyone born 34-1966. Done 03/2018 . TB: certain at-risk populations, or depending on work requirements and/or travel history Other . Bone Density Test: recommended for men at age 30, sooner depending on risk factors            Visit summary with medication list and pertinent instructions was printed for patient to review. All questions at time of visit were answered - patient instructed to contact office with any additional concerns or updates. ER/RTC precautions were reviewed with the patient.   Please note: voice recognition software was used to produce this document, and typos may escape review. Please contact Dr. Lyn Hollingshead for any needed  clarifications.     Follow-up plan: Return in about 6 months (around 09/01/2019) for monitor BP, see me sooner if needed / based on labs .

## 2019-03-01 NOTE — Telephone Encounter (Signed)
Added

## 2019-03-02 LAB — LIPID PANEL
CHOL/HDL RATIO: 5.9 (calc) — AB (ref <5.0)
CHOLESTEROL: 213 mg/dL — AB (ref <200)
HDL: 36 mg/dL — AB (ref >=40)
LDL CHOLESTEROL (CALC): 147 mg/dL — AB
Non-HDL Cholesterol (Calc): 177 mg/dL (calc) — ABNORMAL HIGH (ref <130)
TRIGLYCERIDES: 163 mg/dL — AB (ref <150)

## 2019-03-02 LAB — COMPLETE METABOLIC PANEL WITH GFR
AG Ratio: 1.3 (calc) (ref 1.0–2.5)
ALBUMIN MSPROF: 4.2 g/dL (ref 3.6–5.1)
ALKALINE PHOSPHATASE (APISO): 64 U/L (ref 35–144)
ALT: 25 U/L (ref 9–46)
AST: 21 U/L (ref 10–35)
BUN: 11 mg/dL (ref 7–25)
CO2: 25 mmol/L (ref 20–32)
CREATININE: 0.98 mg/dL (ref 0.70–1.33)
Calcium: 9.7 mg/dL (ref 8.6–10.3)
Chloride: 103 mmol/L (ref 98–110)
GFR, Est African American: 98 mL/min/{1.73_m2} (ref >=60)
GFR, Est Non African American: 85 mL/min/{1.73_m2} (ref >=60)
GLUCOSE: 98 mg/dL (ref 65–99)
Globulin: 3.3 g/dL (calc) (ref 1.9–3.7)
Potassium: 4.8 mmol/L (ref 3.5–5.3)
Sodium: 139 mmol/L (ref 135–146)
Total Bilirubin: 0.3 mg/dL (ref 0.2–1.2)
Total Protein: 7.5 g/dL (ref 6.1–8.1)

## 2019-03-02 LAB — CBC
HCT: 47.2 % (ref 38.5–50.0)
HEMOGLOBIN: 15.5 g/dL (ref 13.2–17.1)
MCH: 29.2 pg (ref 27.0–33.0)
MCHC: 32.8 g/dL (ref 32.0–36.0)
MCV: 88.9 fL (ref 80.0–100.0)
MPV: 10.5 fL (ref 7.5–12.5)
PLATELETS: 392 10*3/uL (ref 140–400)
RBC: 5.31 10*6/uL (ref 4.20–5.80)
RDW: 12.7 % (ref 11.0–15.0)
WBC: 7.4 10*3/uL (ref 3.8–10.8)

## 2019-03-02 LAB — PSA, TOTAL WITH REFLEX TO PSA, FREE: PSA, TOTAL: 0.7 ng/mL (ref <=4.0)

## 2019-04-12 ENCOUNTER — Ambulatory Visit: Payer: Managed Care, Other (non HMO) | Admitting: Family Medicine

## 2019-04-12 ENCOUNTER — Encounter: Payer: Self-pay | Admitting: Family Medicine

## 2019-04-12 VITALS — BP 114/76 | HR 65 | Temp 98.2°F | Wt 294.0 lb

## 2019-04-12 DIAGNOSIS — M109 Gout, unspecified: Secondary | ICD-10-CM

## 2019-04-12 HISTORY — DX: Gout, unspecified: M10.9

## 2019-04-12 MED ORDER — COLCHICINE 0.6 MG PO TABS: 0.6000 mg | ORAL_TABLET | Freq: Every day | ORAL | 3 refills | Status: DC

## 2019-04-12 MED ORDER — ALLOPURINOL 100 MG PO TABS: 100.0000 mg | ORAL_TABLET | Freq: Every day | ORAL | 1 refills | Status: DC

## 2019-04-12 MED ORDER — HYDROCODONE-ACETAMINOPHEN 5-325 MG PO TABS: 1.0000 | ORAL_TABLET | Freq: Four times a day (QID) | ORAL | 0 refills | Status: DC | PRN

## 2019-04-12 NOTE — Patient Instructions (Signed)
Thank you for coming in today. I think your pain is due to gout.  Take colchicine now for treatment of gout flair up.  Ok to take meloxicam or ibuprofen or aleve.  Ok to take colchicine 2x daily for a few days if needed.   Next take allopurinol to lower uric acid and prevent future gout attacks.  To prevent a gout attack for happening while allopurinol if getting started take colchicine daily for 1-3 months.   We will want to check your uric acid levels in a few months.   Ok to take hydrocodone for severe pain. Do not take and drive or operate dangerous equipment.    Gout  Gout is painful swelling of your joints. Gout is a type of arthritis. It is caused by having too much uric acid in your body. Uric acid is a chemical that is made when your body breaks down substances called purines. If your body has too much uric acid, sharp crystals can form and build up in your joints. This causes pain and swelling. Gout attacks can happen quickly and be very painful (acute gout). Over time, the attacks can affect more joints and happen more often (chronic gout). What are the causes?  Too much uric acid in your blood. This can happen because: ? Your kidneys do not remove enough uric acid from your blood. ? Your body makes too much uric acid. ? You eat too many foods that are high in purines. These foods include organ meats, some seafood, and beer.  Trauma or stress. What increases the risk?  Having a family history of gout.  Being male and middle-aged.  Being male and having gone through menopause.  Being very overweight (obese).  Drinking alcohol, especially beer.  Not having enough water in the body (being dehydrated).  Losing weight too quickly.  Having an organ transplant.  Having lead poisoning.  Taking certain medicines.  Having kidney disease.  Having a skin condition called psoriasis. What are the signs or symptoms? An attack of acute gout usually happens in just one  joint. The most common place is the big toe. Attacks often start at night. Other joints that may be affected include joints of the feet, ankle, knee, fingers, wrist, or elbow. Symptoms of an attack may include:  Very bad pain.  Warmth.  Swelling.  Stiffness.  Shiny, red, or purple skin.  Tenderness. The affected joint may be very painful to touch.  Chills and fever. Chronic gout may cause symptoms more often. More joints may be involved. You may also have white or yellow lumps (tophi) on your hands or feet or in other areas near your joints. How is this treated?  Treatment for this condition has two phases: treating an acute attack and preventing future attacks.  Acute gout treatment may include: ? NSAIDs. ? Steroids. These are taken by mouth or injected into a joint. ? Colchicine. This medicine relieves pain and swelling. It can be given by mouth or through an IV tube.  Preventive treatment may include: ? Taking small doses of NSAIDs or colchicine daily. ? Using a medicine that reduces uric acid levels in your blood. ? Making changes to your diet. You may need to see a food expert (dietitian) about what to eat and drink to prevent gout. Follow these instructions at home: During a gout attack   If told, put ice on the painful area: ? Put ice in a plastic bag. ? Place a towel between your skin and  the bag. ? Leave the ice on for 20 minutes, 2-3 times a day.  Raise (elevate) the painful joint above the level of your heart as often as you can.  Rest the joint as much as possible. If the joint is in your leg, you may be given crutches.  Follow instructions from your doctor about what you cannot eat or drink. Avoiding future gout attacks  Eat a low-purine diet. Avoid foods and drinks such as: ? Liver. ? Kidney. ? Anchovies. ? Asparagus. ? Herring. ? Mushrooms. ? Mussels. ? Beer.  Stay at a healthy weight. If you want to lose weight, talk with your doctor. Do not lose  weight too fast.  Start or continue an exercise plan as told by your doctor. Eating and drinking  Drink enough fluids to keep your pee (urine) pale yellow.  If you drink alcohol: ? Limit how much you use to:  0-1 drink a day for women.  0-2 drinks a day for men. ? Be aware of how much alcohol is in your drink. In the [place] one drink equals one 12 oz bottle of beer (355 mL), one 5 oz glass of wine (148 mL), or one 1 oz glass of hard liquor (44 mL). General instructions  Take over-the-counter and prescription medicines only as told by your doctor.  Do not drive or use heavy machinery while taking prescription pain medicine.  Return to your normal activities as told by your doctor. Ask your doctor what activities are safe for you.  Keep all follow-up visits as told by your doctor. This is important. Contact a doctor if:  You have another gout attack.  You still have symptoms of a gout attack after 10 days of treatment.  You have problems (side effects) because of your medicines.  You have chills or a fever.  You have burning pain when you pee (urinate).  You have pain in your lower back or belly. Get help right away if:  You have very bad pain.  Your pain cannot be controlled.  You cannot pee. Summary  Gout is painful swelling of the joints.  The most common site of pain is the big toe, but it can affect other joints.  Medicines and avoiding some foods can help to prevent and treat gout attacks. This information is not intended to replace advice given to you by your health care provider. Make sure you discuss any questions you have with your health care provider. Document Released: 09/10/2008 Document Revised: 06/24/2018 Document Reviewed: 06/24/2018 Elsevier Interactive Patient Education  2019 Elsevier Inc[employer]   Colchicine tablets or capsules What is this medicine? COLCHICINE (KOL chi seen) is used to prevent or treat attacks of acute gout or gouty arthritis.  This medicine is also used to treat familial Mediterranean fever. This medicine may be used for other purposes; ask your health care provider or pharmacist if you have questions. COMMON BRAND NAME(S): Colcrys, MITIGARE What should I tell my health care provider before I take this medicine? They need to know if you have any of these conditions: -kidney disease -liver disease -an unusual or allergic reaction to colchicine, other medicines, foods, dyes, or preservatives -pregnant or trying to get pregnant -breast-feeding How should I use this medicine? Take this medicine by mouth with a full glass of water. Follow the directions on the prescription label. You can take it with or without food. If it upsets your stomach, take it with food. Take your medicine at regular intervals. Do not take  your medicine more often than directed. A special MedGuide will be given to you by the pharmacist with each prescription and refill. Be sure to read this information carefully each time. Talk to your pediatrician regarding the use of this medicine in children. While this drug may be prescribed for children as young as 81 years old for selected conditions, precautions do apply. Patients over 25 years old may have a stronger reaction and need a smaller dose. Overdosage: If you think you have taken too much of this medicine contact a poison control center or emergency room at once. NOTE: This medicine is only for you. Do not share this medicine with others. What if I miss a dose? If you miss a dose, take it as soon as you can. If it is almost time for your next dose, take only that dose. Do not take double or extra doses. What may interact with this medicine? Do not take this medicine with any of the following medications: -certain antivirals for HIV or hepatitis This medicine may also interact with the following medications: -certain antibiotics like erythromycin or clarithromycin -certain medicines for blood  pressure, heart disease, irregular heart beat -certain medicines for cholesterol like atorvastatin, lovastatin, and simvastatin -certain medicines for fungal infections like ketoconazole, itraconazole, or posaconazole -cyclosporine -grapefruit or grapefruit juice This list may not describe all possible interactions. Give your health care provider a list of all the medicines, herbs, non-prescription drugs, or dietary supplements you use. Also tell them if you smoke, drink alcohol, or use illegal drugs. Some items may interact with your medicine. What should I watch for while using this medicine? Visit your healthcare professional for regular checks on your progress. Tell your healthcare professional if your symptoms do not start to get better or if they get worse. You should make sure you get enough vitamin B12 while you are taking this medicine. Discuss the foods you eat and the vitamins you take with your healthcare professional. This medicine may increase your risk to bruise or bleed. Call you healthcare professional if you notice any unusual bleeding. What side effects may I notice from receiving this medicine? Side effects that you should report to your doctor or health care professional as soon as possible: -allergic reactions like skin rash, itching or hives, swelling of the face, lips, or tongue -low blood counts - this medicine may decrease the number of white blood cells, red blood cells, and platelets. You may be at increased risk for infections and bleeding -pain, tingling, numbness in the hands or feet -severe diarrhea -signs and symptoms of infection like fever; chills; cough; sore throat; pain or trouble passing urine -signs and symptoms of muscle injury like dark urine; trouble passing urine or change in the amount of urine; unusually weak or tired; muscle pain; back pain -unusual bleeding or bruising -unusually weak or tired -vomiting Side effects that usually do not require  medical attention (report these to your doctor or health care professional if they continue or are bothersome): -mild diarrhea -nausea -stomach pain This list may not describe all possible side effects. Call your doctor for medical advice about side effects. You may report side effects to FDA at 1-800-FDA-1088. Where should I keep my medicine? Keep out of the reach of children. Store at room temperature between 15 and 30 degrees C (59 and 86 degrees F). Keep container tightly closed. Protect from light. Throw away any unused medicine after the expiration date. NOTE: This sheet is a summary. It may not  cover all possible information. If you have questions about this medicine, talk to your doctor, pharmacist, or health care provider.  2019 Elsevier/Gold Standard (2018-02-18 18:45:11)   Allopurinol tablets What is this medicine? ALLOPURINOL (al oh PURE i nole) reduces the amount of uric acid the body makes. It is used to treat the symptoms of gout. It is also used to treat or prevent high uric acid levels that occur as a result of certain types of chemotherapy. This medicine may also help patients who frequently have kidney stones. This medicine may be used for other purposes; ask your health care provider or pharmacist if you have questions. COMMON BRAND NAME(S): Zyloprim What should I tell my health care provider before I take this medicine? They need to know if you have any of these conditions: -kidney disease -liver disease -an unusual or allergic reaction to allopurinol, other medicines, foods, dyes, or preservatives -pregnant or trying to get pregnant -breast feeding How should I use this medicine? Take this medicine by mouth with a glass of water. Follow the directions on the prescription label. If this medicine upsets your stomach, take it with food or milk. Take your doses at regular intervals. Do not take your medicine more often than directed. Talk to your pediatrician regarding  the use of this medicine in children. Special care may be needed. While this drug may be prescribed for children as young as 6 years for selected conditions, precautions do apply. Overdosage: If you think you have taken too much of this medicine contact a poison control center or emergency room at once. NOTE: This medicine is only for you. Do not share this medicine with others. What if I miss a dose? If you miss a dose, take it as soon as you can. If it is almost time for your next dose, take only that dose. Do not take double or extra doses. What may interact with this medicine? Do not take this medicine with the following medication: -didanosine, ddI This medicine may also interact with the following medications: -certain antibiotics like amoxicillin, ampicillin -certain medicines for cancer -certain medicines for immunosuppression like azathioprine, cyclosporine, mercaptopurine -chlorpropamide -probenecid -thiazide diuretics, like hydrochlorothiazide -sulfinpyrazone -warfarin This list may not describe all possible interactions. Give your health care provider a list of all the medicines, herbs, non-prescription drugs, or dietary supplements you use. Also tell them if you smoke, drink alcohol, or use illegal drugs. Some items may interact with your medicine. What should I watch for while using this medicine? Visit your doctor or health care professional for regular checks on your progress. If you are taking this medicine to treat gout, you may not have less frequent attacks at first. Keep taking your medicine regularly and the attacks should get better within 2 to 6 weeks. Drink plenty of water (10 to 12 full glasses a day) while you are taking this medicine. This will help to reduce stomach upset and reduce the risk of getting gout or kidney stones. Call your doctor or health care professional at once if you get a skin rash together with chills, fever, sore throat, or nausea and vomiting, if  you have blood in your urine, or difficulty passing urine. Do not take vitamin C without asking your doctor or health care professional. Too much vitamin C can increase the chance of getting kidney stones. You may get drowsy or dizzy. Do not drive, use machinery, or do anything that needs mental alertness until you know how this drug affects you. Do  not stand or sit up quickly, especially if you are an older patient. This reduces the risk of dizzy or fainting spells. Alcohol can make you more drowsy and dizzy. Alcohol can also increase the chance of stomach problems and increase the amount of uric acid in your blood. Avoid alcoholic drinks. What side effects may I notice from receiving this medicine? Side effects that you should report to your doctor or health care professional as soon as possible: -allergic reactions like skin rash, itching or hives, swelling of the face, lips, or tongue -breathing problems -fever with rash, swollen lymph nodes, or swelling of the face -joint pain -muscle pain -redness, blistering, peeling or loosening of the skin, including inside the mouth -signs and symptoms of infection like fever or chills; cough; sore throat -signs and symptoms of kidney injury like trouble passing urine or change in the amount of urine, flank pain -tingling, numbness in the hands or feet -unusual bleeding or bruising -unusually weak or tired Side effects that usually do not require medical attention (report to your doctor or health care professional if they continue or are bothersome): -changes in taste -diarrhea -drowsiness -headache -nausea, vomiting -stomach upset This list may not describe all possible side effects. Call your doctor for medical advice about side effects. You may report side effects to FDA at 1-800-FDA-1088. Where should I keep my medicine? Keep out of the reach of children. Store at room temperature between 15 and 25 degrees C (59 and 77 degrees F). Protect from  light and moisture. Throw away any unused medicine after the expiration date. NOTE: This sheet is a summary. It may not cover all possible information. If you have questions about this medicine, talk to your doctor, pharmacist, or health care provider.  2019 Elsevier/Gold Standard (2017-05-06 15:49:12)

## 2019-04-12 NOTE — Progress Notes (Signed)
Jon Mcbride is a 58 y.o. male who presents to [facility] today for left wrist pain.  Patient developed left wrist soreness starting Wednesday, April 22.  The pain worsened on Friday the 24th developing pain and swelling.  Symptoms are consistent with previous episodes of gout attack.  He is tried water cherry tart and Advil.  He notes this is helped a little and his pain is slowly improving.  He notes considerable discomfort however.  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  He is worried about excessive doses of NSAIDs as he has a history of quadruple bypass.  He notes that he thinks his gout flared up because he ate some chili at work.  He does not drink alcohol much at all.  He had a half a glass of wine a week ago. He does not take uric acid lowering medications. He denies any injury or change in activity to explain his pain.    ROS:  As above  Exam:  BP 114/76   Pulse 65   Temp 98.2 F (36.8 C) (Oral)   Wt 294 lb (133.4 kg)   BMI 37.75 kg/m  Wt Readings from Last 5 Encounters:  04/12/19 294 lb (133.4 kg)  03/01/19 299 lb 1.6 oz (135.7 kg)  10/05/18 (!) 314 lb (142.4 kg)  08/31/18 (!) 308 lb 11.2 oz (140 kg)  04/01/18 (!) 305 lb 12.8 oz (138.7 kg)   General: Well Developed, well nourished, and in no acute distress.  Neuro/Psych: Alert and oriented x3, extra-ocular muscles intact, able to move all 4 extremities, sensation grossly intact. Skin: Warm and dry, no rashes noted.  Respiratory: Not using accessory muscles, speaking in full sentences, trachea midline.  Cardiovascular: Pulses palpable, no extremity edema. Abdomen: Does not appear distended. MSK: Wrist is swollen without erythema.  Tender to palpation dorsal wrist.  Decreased wrist motion due to pain.  Pulses cap refill and sensation are intact distally.    Lab and Radiology Results   Chemistry       Component Value Date/Time   NA 139 03/01/2019 0916   K 4.8 03/01/2019 0916   CL 103 03/01/2019 0916   CO2 25 03/01/2019 0916   BUN 11 03/01/2019 0916   CREATININE 0.98 03/01/2019 0916      Component Value Date/Time   CALCIUM 9.7 03/01/2019 0916   ALKPHOS 67 06/03/2017 1529   AST 21 03/01/2019 0916   ALT 25 03/01/2019 0916   BILITOT 0.3 03/01/2019 0916         Assessment and Plan: 58 y.o. male with left wrist pain and swelling very likely due to gout.  Plan to treat now with colchicine.  Will start allopurinol for uric acid lowering while using colchicine for prophylaxis for 1 to 3 months.  Limited hydrocodone for temporary pain control.  Work note provided recheck as needed.  Check uric acid in 6 weeks or so.   PDMP reviewed during this encounter. No orders of the defined types were placed in this encounter.  Meds ordered this encounter  Medications  . colchicine 0.6 MG tablet    Sig: Take 1 tablet (0.6 mg total) by mouth daily. To prevent gout flair    Dispense:  30 tablet    Refill:  3  . allopurinol (ZYLOPRIM) 100 MG tablet    Sig: Take 1 tablet (100 mg total) by mouth daily. To lower  uric acid    Dispense:  90 tablet    Refill:  1  . HYDROcodone-acetaminophen (NORCO/VICODIN) 5-325 MG tablet    Sig: Take 1 tablet by mouth every 6 (six) hours as needed.    Dispense:  10 tablet    Refill:  0    Historical information moved to improve visibility of documentation.  Past Medical History:  Diagnosis Date  . Gout   . Heart attack (HCC)   . Hypertension    Past Surgical History:  Procedure Laterality Date  . CAROTID STENT INSERTION     Social History   Tobacco Use  . Smoking status: Never Smoker  . Smokeless tobacco: Never Used  Substance Use Topics  . Alcohol use: No   family history includes Cancer in his mother; Hyperlipidemia in his father and mother; Hypertension in his father.  Medications: Current Outpatient Medications  Medication Sig Dispense  Refill  . acetaminophen (TYLENOL) 650 MG CR tablet Take 1 tablet (650 mg total) by mouth every 8 (eight) hours. 90 tablet 3  . aspirin 81 MG EC tablet Take 1 tablet (81 mg total) by mouth daily. 90 tablet 3  . betamethasone dipropionate (DIPROLENE) 0.05 % cream Apply topically 2 (two) times daily. To affected area(s) as needed fo rup to two weeks 45 g 3  . clobetasol (TEMOVATE) 0.05 % external solution APPLY 1 APPLICATION TOPICALLY 2 (TWO) TIMES DAILY. SPRAY FORMULATION 50 mL 0  . diclofenac sodium (VOLTAREN) 1 % GEL APPLY 4 G TOPICALLY 4 (FOUR) TIMES DAILY. TO AFFECTED JOINT. 100 g 1  . irbesartan (AVAPRO) 75 MG tablet Take 1 tablet (75 mg total) by mouth daily. 90 tablet 0  . meloxicam (MOBIC) 15 MG tablet One tab PO qAM with breakfast for 2 weeks, then daily prn pain. 30 tablet 3  . metoprolol succinate (TOPROL-XL) 25 MG 24 hr tablet Take 1 tablet (25 mg total) by mouth daily. 90 tablet 3  . pravastatin (PRAVACHOL) 40 MG tablet Take 1 tablet (40 mg total) by mouth daily. 90 tablet 3  . sildenafil (REVATIO) 20 MG tablet Take 1-5 tablets (20-100 mg total) by mouth as needed (take prior to sex. Use lowest effective dose). DO NOT TAKE WITH NITRATES 50 tablet 3  . allopurinol (ZYLOPRIM) 100 MG tablet Take 1 tablet (100 mg total) by mouth daily. To lower uric acid 90 tablet 1  . colchicine 0.6 MG tablet Take 1 tablet (0.6 mg total) by mouth daily. To prevent gout flair 30 tablet 3  . HYDROcodone-acetaminophen (NORCO/VICODIN) 5-325 MG tablet Take 1 tablet by mouth every 6 (six) hours as needed. 10 tablet 0   No current facility-administered medications for this visit.    No Known Allergies    Discussed warning signs or symptoms. Please see discharge instructions. Patient expresses understanding.

## 2019-05-24 ENCOUNTER — Telehealth: Payer: Self-pay | Admitting: Family Medicine

## 2019-05-24 DIAGNOSIS — M109 Gout, unspecified: Secondary | ICD-10-CM

## 2019-05-24 NOTE — Telephone Encounter (Signed)
Due for recheck uric acid 6 weeks after starting allopurinol.  Lab does not need to be fasting and can be done in near future.

## 2019-05-24 NOTE — Telephone Encounter (Signed)
-----   Message from Gregor Hams, MD sent at 04/12/2019  5:27 PM EDT ----- Regarding: Check uric acid after starting allopurinol in 6 weeks Uric acid 6 weeks after starting allopurinol

## 2019-05-24 NOTE — Telephone Encounter (Signed)
Patient was notified and did not have any questions at this time.

## 2019-06-04 ENCOUNTER — Other Ambulatory Visit: Payer: Self-pay | Admitting: Osteopathic Medicine

## 2019-06-04 NOTE — Telephone Encounter (Signed)
Forwarding medication refill to PCP for review. 

## 2019-07-09 ENCOUNTER — Emergency Department
Admission: EM | Admit: 2019-07-09 | Discharge: 2019-07-09 | Disposition: A | Discharge status: Home / Self Care | Payer: Managed Care, Other (non HMO) | Source: Home / Self Care

## 2019-07-09 ENCOUNTER — Other Ambulatory Visit: Payer: Self-pay

## 2019-07-09 ENCOUNTER — Telehealth: Payer: Self-pay

## 2019-07-09 ENCOUNTER — Encounter: Payer: Self-pay | Admitting: *Deleted

## 2019-07-09 DIAGNOSIS — R0789 Other chest pain: Secondary | ICD-10-CM

## 2019-07-09 DIAGNOSIS — R55 Syncope and collapse: Secondary | ICD-10-CM

## 2019-07-09 MED ORDER — OMEPRAZOLE 20 MG PO CPDR: 20.0000 mg | DELAYED_RELEASE_CAPSULE | Freq: Every day | ORAL | 0 refills | Status: DC

## 2019-07-09 NOTE — ED Triage Notes (Signed)
Pt c/o intermittent "heart burn" like pain in the center of his chest. Fatigue x 2-3 wks.

## 2019-07-09 NOTE — Telephone Encounter (Signed)
Noted, thanks!

## 2019-07-09 NOTE — ED Provider Notes (Signed)
[name]    CSN: [id number] Arrival date & time: 07/09/19  1203     History   Chief Complaint Chief Complaint  Patient presents with  . Chest Pain    HPI Jon Mcbride is a 58 y.o. male.   HPI Jon Mcbride is a 58 y.o. male presenting to UC with c/o intermittent "heart burn" in the center of his chest for a days and 2-3 weeks of fatigue. Associated episode of lightheadedness earlier today at work, pt felt like he was going to pass out. He had a nurse check his BP with a forearm cuff, which showed his BP 98/55, manual recheck was 105/59.  He waited a few minutes later and walked down a hallway to the nursing director, by then his BP was 130/85.  Pt unsure if his BP had dropped earlier or if it had gone up due to feeling anxious.  He is concerned his cholesterol medication may be contributing to some of his symptoms. He has not discussed his concerns with his PCP.  He is due for lab work and plans to go to the lab today to get his blood drawn.   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  He denies fever, chills, n/v/d. Denies cough or congestion. No SOB.    Past Medical History:  Diagnosis Date  . Gout   . Heart attack (HCC)   . Hypertension     Patient Active Problem List   Diagnosis Date Noted  . Acute gout of left wrist 04/12/2019  . Psoriasis 11/06/2018  . Bilateral primary osteoarthritis of knee 10/07/2018  . Morbid obesity (HCC) 10/06/2018  . Obstructive sleep apnea 02/26/2018  . 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 HTN (hypertension), benign 01/01/2013  . Stented coronary artery 01/01/2013    Past Surgical History:  Procedure Laterality Date  . CAROTID STENT INSERTION         Home Medications    Prior to Admission medications   Medication Sig Start Date End Date Taking? Authorizing Provider  allopurinol (ZYLOPRIM) 100 MG tablet Take 1  tablet (100 mg total) by mouth daily. To lower uric acid 04/12/19   Rodolph Bong, MD  aspirin 81 MG EC tablet Take 1 tablet (81 mg total) by mouth daily. 08/31/18   Sunnie Nielsen, DO  colchicine 0.6 MG tablet Take 1 tablet (0.6 mg total) by mouth daily. To prevent gout flair 04/12/19   Rodolph Bong, MD  irbesartan (AVAPRO) 75 MG tablet TAKE 1 TABLET BY MOUTH EVERY DAY 06/04/19   Sunnie Nielsen, DO  metoprolol succinate (TOPROL-XL) 25 MG 24 hr tablet Take 1 tablet (25 mg total) by mouth daily. 08/31/18   Sunnie Nielsen, DO  omeprazole (PRILOSEC) 20 MG capsule Take 1 capsule (20 mg total) by mouth daily. 07/09/19   Lurene Shadow, PA-C  pravastatin (PRAVACHOL) 40 MG tablet Take 1 tablet (40 mg total) by mouth daily. 08/31/18   Sunnie Nielsen, DO  sildenafil (REVATIO) 20 MG tablet Take 1-5 tablets (20-100 mg total) by mouth as needed (take prior to sex. Use lowest effective dose). DO NOT TAKE WITH NITRATES 11/13/17   Sunnie Nielsen, DO    Family History Family History  Problem Relation Age of Onset  . Hypertension Father   . Hyperlipidemia Father   . Cancer Mother   . Hyperlipidemia Mother     Social History Social History  Tobacco Use  . Smoking status: Never Smoker  . Smokeless tobacco: Never Used  Substance Use Topics  . Alcohol use: No  . Drug use: No     Allergies   Patient has no known allergies.   Review of Systems Review of Systems  Constitutional: Negative for chills and fever.  HENT: Negative for congestion, ear pain, sore throat, trouble swallowing and voice change.   Respiratory: Negative for cough and shortness of breath.   Cardiovascular: Positive for chest pain. Negative for palpitations.  Gastrointestinal: Positive for abdominal pain (upper). Negative for diarrhea, nausea and vomiting.  Musculoskeletal: Negative for arthralgias, back pain and myalgias.  Skin: Negative for rash.  Neurological: Positive for light-headedness. Negative for  dizziness, syncope, weakness and headaches.     Physical Exam Triage Vital Signs ED Triage Vitals  Enc Vitals Group     BP 07/09/19 1235 110/75     Pulse Rate 07/09/19 1235 71     Resp 07/09/19 1235 18     Temp 07/09/19 1235 98.8 F (37.1 C)     Temp Source 07/09/19 1235 Oral     SpO2 07/09/19 1235 96 %     Weight 07/09/19 1236 297 lb (134.7 kg)     Height 07/09/19 1236 6\' 2"  (1.88 m)     Head Circumference --      Peak Flow --      Pain Score 07/09/19 1236 2     Pain Loc --      Pain Edu? --      Excl. in [place]? --    No data found.  Updated Vital Signs BP 110/75 (BP Location: Right Arm)   Pulse 71   Temp 98.8 F (37.1 C) (Oral)   Resp 18   Ht 6\' 2"  (1.88 m)   Wt 297 lb (134.7 kg)   SpO2 96%   BMI 38.13 kg/m   Visual Acuity Right Eye Distance:   Left Eye Distance:   Bilateral Distance:    Right Eye Near:   Left Eye Near:    Bilateral Near:     Physical Exam Vitals signs and nursing note reviewed.  Constitutional:      Appearance: He is well-developed.  HENT:     Head: Normocephalic and atraumatic.  Neck:     Musculoskeletal: Normal range of motion.  Cardiovascular:     Rate and Rhythm: Normal rate and regular rhythm.  Pulmonary:     Effort: Pulmonary effort is normal.     Breath sounds: Normal breath sounds. No decreased breath sounds, wheezing, rhonchi or rales.  Chest:     Chest wall: No tenderness.  Abdominal:     General: There is no distension.     Palpations: Abdomen is soft.     Tenderness: There is no abdominal tenderness. There is no right CVA tenderness or left CVA tenderness.  Musculoskeletal: Normal range of motion.  Skin:    General: Skin is warm and dry.  Neurological:     Mental Status: He is alert and oriented to person, place, and time.  Psychiatric:        Behavior: Behavior normal.      UC Treatments / Results  Labs (all labs ordered are listed, but only abnormal results are displayed) Labs Reviewed - No data to display   EKG Normal sinus rhythm, normal EKG.  See scanned EKG   Radiology No results found.  Procedures Procedures (including critical care time)  Medications Ordered in UC Medications -  No data to display  Initial Impression / Assessment and Plan / UC Course  I have reviewed the triage vital signs and the nursing notes.  Pertinent labs & imaging results that were available during my care of the patient were reviewed by me and considered in my medical decision making (see chart for details).     Reassured pt of normal vitals and normal EKG Normal exam. Encouraged to follow up with his family doctor to discuss concerns about his cholesterol medication.  Also stressed importance to f/u with a cardiologist given his cardiac history  AVS provided Discussed symptoms that warrant emergent care in the ED.   Final Clinical Impressions(s) / UC Diagnoses   Final diagnoses:  Atypical chest pain  Near syncope     Discharge Instructions      You may try the newly prescribed omeprazole to see if this medication helps with your chest discomfort as it may be due to acid reflux.   Be sure to stay well hydrated, especially when the temperature is in the 90s*F and when doing physical labor.   Try to get at least 8 hours of sleep at night.  Please call your family doctor to schedule a follow up appointment next week to discuss your concerns about your cholesterol medication contributing to your chest discomfort/anxiety sensation.  She may decide to change the dose, change medication or take you off cholesterol medication completely depending on your lab results.    It is also very important to re-establish care with a cardiologist given your heart history.  Typically heart patients are followed once a year and sometimes even every 6 months.    Your EKG was normal today, however, if you develop worsening symptoms or new symptoms such as trouble breathing, please call 911 or have someone drive  you to the closest hospital.     ED Prescriptions    Medication Sig Dispense Auth. Provider   omeprazole (PRILOSEC) 20 MG capsule Take 1 capsule (20 mg total) by mouth daily. 30 capsule Lurene ShadowPhelps, Azrielle Springsteen O, PA-C     Controlled Substance Prescriptions [place] Controlled Substance Registry consulted? Not Applicable   Lurene Shadowhelps, Lennette Fader O, PA-C 07/09/19 1400

## 2019-07-09 NOTE — Telephone Encounter (Signed)
Patient called stating he has some "interesting" symptoms the last couple days. Reports a "fullness" painful feeling in his lower esophagus, which concerns him since he has had quadruple bypass. Patient denies it feeling like chest pain, states it just feels like "a really low part of my esophagus".   Patient also reports having some dizzy spells and reports he had his blood pressure taken 3 times at work recently and the readings were 100/55, 95/56, and 130/82. Patient states similar symptoms appeared a few months ago and he went to ER and was told he was dehydrated. Patient is concerned currently about possibly heart problem.   Due to COVID, patient is reluctant to go to ER. Patient reported he was tested for COVID recently and the results were negative. Advised patient he could be evaluated at the Urgent Care, and they may still send him to the hospital if needed. Patient agreeable. On way to Urgent Care now.   FYI to PCP

## 2019-07-09 NOTE — Discharge Instructions (Signed)
°  You may try the newly prescribed omeprazole to see if this medication helps with your chest discomfort as it may be due to acid reflux.   Be sure to stay well hydrated, especially when the temperature is in the 90s*F and when doing physical labor.   Try to get at least 8 hours of sleep at night.  Please call your family doctor to schedule a follow up appointment next week to discuss your concerns about your cholesterol medication contributing to your chest discomfort/anxiety sensation.  She may decide to change the dose, change medication or take you off cholesterol medication completely depending on your lab results.    It is also very important to re-establish care with a cardiologist given your heart history.  Typically heart patients are followed once a year and sometimes even every 6 months.    Your EKG was normal today, however, if you develop worsening symptoms or new symptoms such as trouble breathing, please call 911 or have someone drive you to the closest hospital.

## 2019-07-10 LAB — URIC ACID: Uric Acid, Serum: 7.3 mg/dL (ref 4.0–8.0)

## 2019-07-12 ENCOUNTER — Telehealth: Payer: Self-pay | Admitting: Family Medicine

## 2019-07-12 MED ORDER — ALLOPURINOL 300 MG PO TABS: 300.0000 mg | ORAL_TABLET | Freq: Every day | ORAL | 1 refills | Status: DC

## 2019-07-12 NOTE — Telephone Encounter (Signed)
Uric acid is still a bit high.  Plan increase allopurinol to 300 mg.  Will recheck in about 6 weeks.

## 2019-07-15 NOTE — Telephone Encounter (Signed)
Patient advised of results and recommendations.  

## 2019-08-24 ENCOUNTER — Telehealth: Payer: Self-pay | Admitting: Family Medicine

## 2019-08-24 DIAGNOSIS — M109 Gout, unspecified: Secondary | ICD-10-CM

## 2019-08-24 DIAGNOSIS — I10 Essential (primary) hypertension: Secondary | ICD-10-CM

## 2019-08-24 NOTE — Telephone Encounter (Signed)
Pt advised, will get labs done

## 2019-08-24 NOTE — Telephone Encounter (Signed)
Uric acid and kidney labs were ordered to be done in near future.  This is to check the uric acid after increasing allopurinol.

## 2019-08-24 NOTE — Telephone Encounter (Signed)
-----   Message from Gregor Hams, MD sent at 07/12/2019  6:13 AM EDT ----- Regarding: Recheck uric acid and CMP 6 weeks Allopurinol increased.  Recheck uric acid in about [redacted] weeks along with other kidney labs

## 2019-09-01 ENCOUNTER — Telehealth: Payer: Self-pay

## 2019-09-01 NOTE — Telephone Encounter (Signed)
Not to my knowledge.

## 2019-09-01 NOTE — Telephone Encounter (Signed)
Barbara from Dr Rosita Fire (dentist) office called stating pt has appt tomorrow and wants to know if patient needs any pre-med.   Phone: [phone] Fax: [phone]

## 2019-09-02 ENCOUNTER — Other Ambulatory Visit: Payer: Self-pay | Admitting: Family Medicine

## 2019-09-02 ENCOUNTER — Other Ambulatory Visit: Payer: Self-pay | Admitting: Osteopathic Medicine

## 2019-09-02 DIAGNOSIS — I10 Essential (primary) hypertension: Secondary | ICD-10-CM

## 2019-09-02 DIAGNOSIS — Z951 Presence of aortocoronary bypass graft: Secondary | ICD-10-CM

## 2019-09-02 NOTE — Telephone Encounter (Signed)
Left msg for Pamala Hurry advising

## 2019-09-08 ENCOUNTER — Emergency Department (INDEPENDENT_AMBULATORY_CARE_PROVIDER_SITE_OTHER): Payer: Managed Care, Other (non HMO)

## 2019-09-08 ENCOUNTER — Telehealth: Payer: Self-pay

## 2019-09-08 ENCOUNTER — Encounter: Payer: Self-pay | Admitting: Emergency Medicine

## 2019-09-08 ENCOUNTER — Other Ambulatory Visit: Payer: Self-pay

## 2019-09-08 ENCOUNTER — Emergency Department
Admission: EM | Admit: 2019-09-08 | Discharge: 2019-09-08 | Disposition: A | Discharge status: Home / Self Care | Payer: Managed Care, Other (non HMO) | Source: Home / Self Care

## 2019-09-08 DIAGNOSIS — J9811 Atelectasis: Secondary | ICD-10-CM | POA: Diagnosis not present

## 2019-09-08 DIAGNOSIS — R079 Chest pain, unspecified: Secondary | ICD-10-CM

## 2019-09-08 DIAGNOSIS — U071 COVID-19: Secondary | ICD-10-CM | POA: Diagnosis not present

## 2019-09-08 MED ORDER — PREDNISONE 50 MG PO TABS: 50.0000 mg | ORAL_TABLET | Freq: Every day | ORAL | 0 refills | Status: AC

## 2019-09-08 MED ORDER — DOXYCYCLINE HYCLATE 100 MG PO CAPS: 100.0000 mg | ORAL_CAPSULE | Freq: Two times a day (BID) | ORAL | 0 refills | Status: DC

## 2019-09-08 NOTE — ED Triage Notes (Signed)
SOB, started yesterday, had COVID 2 months ago says after working all day right chest felt sore.

## 2019-09-08 NOTE — Telephone Encounter (Signed)
Attempted to contact Pt, no answer. Left VM for Pt to return clinic call. Advised if he is having shortness of breath he needs to seek emergent care

## 2019-09-08 NOTE — Discharge Instructions (Signed)
°  Please take antibiotics as prescribed and be sure to complete entire course even if you start to feel better to ensure infection does not come back.  You may take 500mg  acetaminophen every 4-6 hours or in combination with ibuprofen 400-600mg  every 6-8 hours as needed for pain, inflammation, and fever.  Be sure to well hydrated with clear liquids and get at least 8 hours of sleep at night, preferably more while sick.   Call to schedule a follow up with your family doctor at he end of this week or early next week for recheck of symptoms, especially if not improving.  Call 911 or go to the hospital if symptoms worsening.

## 2019-09-08 NOTE — Telephone Encounter (Signed)
Update - Pt in UC for eval currently

## 2019-09-08 NOTE — ED Provider Notes (Signed)
[facility]    CSN: [id number] Arrival date & time: 09/08/19  1113      History   Chief Complaint Chief Complaint  Patient presents with  . Shortness of Breath    HPI Jon Mcbride is a 58 y.o. male.   HPI Jon Mcbride is a 58 y.o. male presenting to UC with c/o intermittent Right side burning chest pain with inspiration.  He reports testing Positive for Covid-19 at work about 2 months ago. He reports having 3 days of fatigue, body aches and mild cough.  He returned to work yesterday after having 2 negative Covid tests. 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 He reports having trouble breathing when wearing a mask and also thinks pushing patients around all day yesterday has contributed to his current chest pain. Pain is a burning sensation, 5/10 at worst. No medication tried PTA. Denies fever, chills, n/v/d. Denies cough or SOB at this time but does have the SOB when working and wearing his mask.   Pt was seen at [facility] about 2 months ago for chest pain and states he felt like that pain was from GERD.  This pain feels different. He also had a CABG 3-4 years ago and states this pain does not feel like that either.   Past Medical History:  Diagnosis Date  . Gout   . Heart attack ([place])   . Hypertension     Patient Active Problem List   Diagnosis Date Noted  . Acute gout of left wrist 04/12/2019  . Psoriasis 11/06/2018  . Bilateral primary osteoarthritis of knee 10/07/2018  . Morbid obesity ([place]) 10/06/2018  . Obstructive sleep apnea 02/26/2018  . 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 HTN (hypertension), benign 01/01/2013  . Stented coronary artery 01/01/2013    Past Surgical History:  Procedure Laterality Date  . CAROTID STENT INSERTION         Home Medications    Prior to Admission medications   Medication Sig Start Date End Date Taking? Authorizing Provider  allopurinol (ZYLOPRIM) 300 MG tablet Take 1 tablet (300 mg  total) by mouth daily. To lower uric acid 07/12/19   Gregor Hams, MD  aspirin 81 MG EC tablet Take 1 tablet (81 mg total) by mouth daily. 08/31/18   Alexander, Lanelle Bal, DO  COLCRYS 0.6 MG tablet TAKE 1 TABLET (0.6 MG TOTAL) BY MOUTH DAILY. TO PREVENT GOUT FLAIR 09/06/19   Emeterio Reeve, DO  doxycycline (VIBRAMYCIN) 100 MG capsule Take 1 capsule (100 mg total) by mouth 2 (two) times daily. One po bid x 7 days 09/08/19   Noe Gens, PA-C  irbesartan (AVAPRO) 75 MG tablet TAKE 1 TABLET BY MOUTH EVERY DAY 06/04/19   Emeterio Reeve, DO  metoprolol succinate (TOPROL-XL) 25 MG 24 hr tablet TAKE 1 TABLET BY MOUTH EVERY DAY 09/03/19   Emeterio Reeve, DO  omeprazole (PRILOSEC) 20 MG capsule Take 1 capsule (20 mg total) by mouth daily. 07/09/19   Noe Gens, PA-C  pravastatin (PRAVACHOL) 40 MG tablet TAKE 1 TABLET BY MOUTH EVERY DAY 09/03/19   Emeterio Reeve, DO  predniSONE (DELTASONE) 50 MG tablet Take 1 tablet (50 mg total) by mouth daily with breakfast for 5 days. 09/08/19 09/13/19  Noe Gens, PA-C  sildenafil (REVATIO) 20 MG tablet Take 1-5 tablets (20-100 mg total) by mouth as needed (take prior to sex. Use lowest effective dose). DO NOT TAKE WITH NITRATES 11/13/17   Sheppard Coil,  Dorene Grebe, DO    Family History Family History  Problem Relation Age of Onset  . Hypertension Father   . Hyperlipidemia Father   . Cancer Mother   . Hyperlipidemia Mother     Social History Social History   Tobacco Use  . Smoking status: Never Smoker  . Smokeless tobacco: Never Used  Substance Use Topics  . Alcohol use: No  . Drug use: No     Allergies   Patient has no known allergies.   Review of Systems Review of Systems  Constitutional: Negative for chills and fever.  HENT: Negative for congestion, ear pain, sore throat, trouble swallowing and voice change.   Respiratory: Positive for cough (minimal) and shortness of breath.   Cardiovascular: Positive for chest pain (Right side).  Negative for palpitations.  Gastrointestinal: Negative for abdominal pain, diarrhea, nausea and vomiting.  Musculoskeletal: Negative for arthralgias, back pain and myalgias.  Skin: Negative for rash.     Physical Exam Triage Vital Signs ED Triage Vitals  Enc Vitals Group     BP 09/08/19 1205 115/77     Pulse Rate 09/08/19 1205 79     Resp --      Temp 09/08/19 1205 98.4 F (36.9 C)     Temp Source 09/08/19 1205 Oral     SpO2 09/08/19 1205 97 %     Weight 09/08/19 1154 290 lb (131.5 kg)     Height 09/08/19 1154 6\' 2"  (1.88 m)     Head Circumference --      Peak Flow --      Pain Score 09/08/19 1154 5     Pain Loc --      Pain Edu? --      Excl. in [place]? --    No data found.  Updated Vital Signs BP 115/77 (BP Location: Right Arm)   Pulse 79   Temp 98.4 F (36.9 C) (Oral)   Ht 6\' 2"  (1.88 m)   Wt 290 lb (131.5 kg)   SpO2 97%   BMI 37.23 kg/m   Visual Acuity Right Eye Distance:   Left Eye Distance:   Bilateral Distance:    Right Eye Near:   Left Eye Near:    Bilateral Near:     Physical Exam Vitals signs and nursing note reviewed.  Constitutional:      Appearance: He is well-developed.  HENT:     Head: Normocephalic and atraumatic.  Neck:     Musculoskeletal: Normal range of motion.  Cardiovascular:     Rate and Rhythm: Normal rate and regular rhythm.  Pulmonary:     Effort: Pulmonary effort is normal.     Breath sounds: Normal breath sounds. No decreased breath sounds, wheezing, rhonchi or rales.  Chest:     Chest wall: No mass, deformity or tenderness.  Musculoskeletal: Normal range of motion.  Skin:    General: Skin is warm and dry.  Neurological:     Mental Status: He is alert and oriented to person, place, and time.  Psychiatric:        Behavior: Behavior normal.      UC Treatments / Results  Labs (all labs ordered are listed, but only abnormal results are displayed) Labs Reviewed - No data to display  EKG   Radiology Dg Chest 2 View   Result Date: 09/08/2019 CLINICAL DATA:  COVID-19. EXAM: CHEST - 2 VIEW COMPARISON:  06/06/2012 report. FINDINGS: Prominence of the anterior mediastinum on lateral view is most likely related to  patient rotation and mediastinal fat. Prior CABG. Heart size normal. No pulmonary venous congestion. Elevation right hemidiaphragm. Mild right base subsegmental atelectasis. Right base infiltrate cannot be excluded. No pleural effusion or pneumothorax. IMPRESSION: 1. Elevation right hemidiaphragm with right base atelectasis. Right base infiltrate cannot be excluded. 2.  Prior CABG.  Heart size normal. Electronically Signed   By: Maisie Fus  Register   On: 09/08/2019 13:22    Procedures Procedures (including critical care time)  Medications Ordered in UC Medications - No data to display  Initial Impression / Assessment and Plan / UC Course  I have reviewed the triage vital signs and the nursing notes.  Pertinent labs & imaging results that were available during my care of the patient were reviewed by me and considered in my medical decision making (see chart for details).     Lungs: CTAB and O2 Sat 97% on RA while wearing his protective mask.  Will order CXR to ensure no visible underlying cause of his symptoms. Doubt ACS. Reviewed imaging with pt Will tx for suspected early pneumonia. Pt on colchicine, which interacts with Azithromycin, will start pt on Doxycycline. AVS provided.   Final Clinical Impressions(s) / UC Diagnoses   Final diagnoses:  Atelectasis of right lung  Right-sided chest pain     Discharge Instructions      Please take antibiotics as prescribed and be sure to complete entire course even if you start to feel better to ensure infection does not come back.  You may take 500mg  acetaminophen every 4-6 hours or in combination with ibuprofen 400-600mg  every 6-8 hours as needed for pain, inflammation, and fever.  Be sure to well hydrated with clear liquids and get at least 8  hours of sleep at night, preferably more while sick.   Call to schedule a follow up with your family doctor at he end of this week or early next week for recheck of symptoms, especially if not improving.  Call 911 or go to the hospital if symptoms worsening.     ED Prescriptions    Medication Sig Dispense Auth. Provider   predniSONE (DELTASONE) 50 MG tablet Take 1 tablet (50 mg total) by mouth daily with breakfast for 5 days. 5 tablet O, PA-C   doxycycline (VIBRAMYCIN) 100 MG capsule Take 1 capsule (100 mg total) by mouth 2 (two) times daily. One po bid x 7 days 14 capsule Waylan Rocher, Lurene Shadow     PDMP not reviewed this encounter.   [place], Lurene Shadow 09/08/19 1436

## 2019-09-08 NOTE — Telephone Encounter (Signed)
Pt left a vm msg requesting an appt with provider. As per pt, having difficulty in breathing from one of his lungs. Pls contact pt for scheduling. Thanks.

## 2019-10-09 ENCOUNTER — Other Ambulatory Visit: Payer: Self-pay | Admitting: Osteopathic Medicine

## 2019-10-09 NOTE — Telephone Encounter (Signed)
Forwarding medication refill request to the clinical pool for review. 

## 2019-10-25 ENCOUNTER — Other Ambulatory Visit: Payer: Self-pay | Admitting: Osteopathic Medicine

## 2019-10-25 NOTE — Telephone Encounter (Signed)
Requested medication (s) are due for refill today: yes  Requested medication (s) are on the active medication list: yes  Last refill: 10/09/2019  Future visit scheduled: no  Notes to clinic:  Patient requesting a 90 day supply   Requested Prescriptions  Pending Prescriptions Disp Refills   COLCRYS 0.6 MG tablet [Pharmacy Med Name: COLCRYS 0.6 MG TABLET] 90 tablet 1    Sig: TAKE 1 TABLET BY MOUTH DAILY TO PREVENT GOUT FLAIR     Endocrinology:  Gout Agents Failed - 10/25/2019  8:58 AM      Failed - Valid encounter within last 12 months    Recent Outpatient Visits          6 months ago Acute gout of left wrist, unspecified cause   [place] [facility], Rebekah [place], MD   7 months ago Annual physical exam   [facility], [place], DO   11 months ago Bilateral primary osteoarthritis of knee   [place] [facility], Gwen Her, MD   1 year ago Bilateral primary osteoarthritis of knee   [place] [facility], Gwen Her, MD   1 year ago Bilateral primary osteoarthritis of knee    [facility], Gwen Her, MD             Passed - Uric Acid in normal range and within 360 days    Uric Acid, Serum  Date Value Ref Range Status  07/09/2019 7.3 4.0 - 8.0 mg/dL Final    Comment:    Therapeutic target for gout patients: <6.0 mg/dL .          Passed - Cr in normal range and within 360 days    Creat  Date Value Ref Range Status  03/01/2019 0.98 0.70 - 1.33 mg/dL Final    Comment:    For patients >66 years of age, the reference limit for Creatinine is approximately 13% higher for people identified as African-American. Marland Kitchen

## 2019-10-30 ENCOUNTER — Other Ambulatory Visit: Payer: Self-pay | Admitting: Osteopathic Medicine

## 2019-10-30 ENCOUNTER — Other Ambulatory Visit: Payer: Self-pay | Admitting: Family Medicine

## 2019-10-30 DIAGNOSIS — I10 Essential (primary) hypertension: Secondary | ICD-10-CM

## 2019-10-30 DIAGNOSIS — Z951 Presence of aortocoronary bypass graft: Secondary | ICD-10-CM

## 2019-10-31 NOTE — Telephone Encounter (Signed)
Requested medication (s) are due for refill today: yes  Requested medication (s) are on the active medication list: yes  Last refill: 09/30/2019  Future visit scheduled: no  Notes to clinic: overdue for office visit  Review for refill   Requested Prescriptions  Pending Prescriptions Disp Refills   irbesartan (AVAPRO) 75 MG tablet [Pharmacy Med Name: IRBESARTAN 75 MG TABLET] 30 tablet 5    Sig: TAKE 1 TABLET BY MOUTH EVERY DAY     Cardiovascular:  Angiotensin Receptor Blockers Failed - 10/30/2019 10:31 AM      Failed - Cr in normal range and within 180 days    Creat  Date Value Ref Range Status  03/01/2019 0.98 0.70 - 1.33 mg/dL Final    Comment:    For patients >65 years of age, the reference limit for Creatinine is approximately 13% higher for people identified as African-American. .          Failed - K in normal range and within 180 days    Potassium  Date Value Ref Range Status  03/01/2019 4.8 3.5 - 5.3 mmol/L Final         Failed - Valid encounter within last 6 months    Recent Outpatient Visits          6 months ago Acute gout of left wrist, unspecified cause   [facility], Rebekah [place], MD   8 months ago Annual physical exam   [place] [facility], [place], DO   11 months ago Bilateral primary osteoarthritis of knee   [place] [facility], Gwen Her, MD   1 year ago Bilateral primary osteoarthritis of knee   [place] [facility], Gwen Her, MD   1 year ago Bilateral primary osteoarthritis of knee   [place] [facility], Gwen Her, MD             Passed - Patient is not pregnant      Passed - Last BP in normal range    BP Readings from Last 1 Encounters:  09/08/19 115/77          pravastatin (PRAVACHOL) 40 MG tablet [Pharmacy Med Name: PRAVASTATIN SODIUM 40 MG  TAB] 30 tablet 2    Sig: TAKE 1 TABLET BY MOUTH EVERY DAY     Cardiovascular:  Antilipid - Statins Failed - 10/30/2019 10:31 AM      Failed - Total Cholesterol in normal range and within 360 days    Cholesterol  Date Value Ref Range Status  03/01/2019 213 (H) <200 mg/dL Final         Failed - LDL in normal range and within 360 days    LDL Cholesterol (Calc)  Date Value Ref Range Status  03/01/2019 147 (H) mg/dL (calc) Final    Comment:    Reference range: <100 . Desirable range <100 mg/dL for primary prevention;   <70 mg/dL for patients with CHD or diabetic patients  with > or = 2 CHD risk factors. Marland Kitchen LDL-C is now calculated using the Martin-Hopkins  calculation, which is a validated novel method providing  better accuracy than the Friedewald equation in the  estimation of LDL-C.  Cresenciano Genre et al. Annamaria Helling. 4270;623(76): 2061-2068  (http://education.[employer].com/faq/FAQ164)          Failed - HDL in normal range and within 360 days    HDL  Date Value  Ref Range Status  03/01/2019 36 (L) > OR = 40 mg/dL Final         Failed - Triglycerides in normal range and within 360 days    Triglycerides  Date Value Ref Range Status  03/01/2019 163 (H) <150 mg/dL Final         Failed - Valid encounter within last 12 months    Recent Outpatient Visits          6 months ago Acute gout of left wrist, unspecified cause   [place] [facility], Michel Harrow, MD   8 months ago Annual physical exam   [facility], [place], DO   11 months ago Bilateral primary osteoarthritis of knee   [place] [facility], Ihor [place], MD   1 year ago Bilateral primary osteoarthritis of knee   [place] [facility], Ihor Austin, MD   1 year ago Bilateral primary osteoarthritis of knee   [place] [facility], Ihor Austin,  MD             Passed - Patient is not pregnant       metoprolol succinate (TOPROL-XL) 25 MG 24 hr tablet [Pharmacy Med Name: METOPROLOL SUCC ER 25 MG TAB] 30 tablet 2    Sig: TAKE 1 TABLET BY MOUTH EVERY DAY     Cardiovascular:  Beta Blockers Failed - 10/30/2019 10:31 AM      Failed - Valid encounter within last 6 months    Recent Outpatient Visits          6 months ago Acute gout of left wrist, unspecified cause   [place] [facility], Michel Harrow, MD   8 months ago Annual physical exam   [facility], [place], DO   11 months ago Bilateral primary osteoarthritis of knee   [place] [facility], Ihor Austin, MD   1 year ago Bilateral primary osteoarthritis of knee   [place] [facility], Ihor Austin, MD   1 year ago Bilateral primary osteoarthritis of knee   [place] [facility], Ihor Austin, MD             Passed - Last BP in normal range    BP Readings from Last 1 Encounters:  09/08/19 115/77         Passed - Last Heart Rate in normal range    Pulse Readings from Last 1 Encounters:  09/08/19 79

## 2019-11-01 NOTE — Telephone Encounter (Signed)
Needs appointment

## 2019-11-14 ENCOUNTER — Other Ambulatory Visit: Payer: Self-pay | Admitting: Osteopathic Medicine

## 2019-11-14 DIAGNOSIS — I10 Essential (primary) hypertension: Secondary | ICD-10-CM

## 2019-11-14 DIAGNOSIS — Z951 Presence of aortocoronary bypass graft: Secondary | ICD-10-CM

## 2019-12-20 ENCOUNTER — Other Ambulatory Visit: Payer: Self-pay | Admitting: Osteopathic Medicine

## 2019-12-20 DIAGNOSIS — Z951 Presence of aortocoronary bypass graft: Secondary | ICD-10-CM

## 2019-12-20 DIAGNOSIS — I10 Essential (primary) hypertension: Secondary | ICD-10-CM

## 2019-12-20 MED ORDER — SODIUM CHLORIDE 0.9 % IV SOLN
20.00 | INTRAVENOUS | Status: DC
Start: ? — End: 2019-12-20

## 2019-12-21 NOTE — Telephone Encounter (Signed)
No more refills until appointment made 

## 2020-02-05 ENCOUNTER — Other Ambulatory Visit: Payer: Self-pay | Admitting: Osteopathic Medicine

## 2020-02-05 DIAGNOSIS — Z951 Presence of aortocoronary bypass graft: Secondary | ICD-10-CM

## 2020-02-05 DIAGNOSIS — I10 Essential (primary) hypertension: Secondary | ICD-10-CM

## 2020-02-07 ENCOUNTER — Telehealth: Payer: Self-pay | Admitting: Osteopathic Medicine

## 2020-02-07 NOTE — Telephone Encounter (Signed)
I have sent a 15 day supply of the patient's medications and he will need a follow up to get further refills. Thanks

## 2020-02-07 NOTE — Telephone Encounter (Signed)
Left VM to schedule appointment

## 2020-02-24 ENCOUNTER — Other Ambulatory Visit: Payer: Self-pay | Admitting: Osteopathic Medicine

## 2020-02-24 ENCOUNTER — Other Ambulatory Visit: Payer: Self-pay | Admitting: Family Medicine

## 2020-02-24 DIAGNOSIS — Z951 Presence of aortocoronary bypass graft: Secondary | ICD-10-CM

## 2020-02-24 DIAGNOSIS — I10 Essential (primary) hypertension: Secondary | ICD-10-CM

## 2020-02-24 NOTE — Telephone Encounter (Signed)
Spoke to patient concerning med refills being inappropriate without office visit.   Patient states recent job change. Has to wait 45 days for insurance coverage to become effective.   Forwarded call to Scheduler for exactly 45 days following call.   Sent 30 day refill.

## 2020-03-08 ENCOUNTER — Other Ambulatory Visit: Payer: Self-pay | Admitting: Osteopathic Medicine

## 2020-03-08 DIAGNOSIS — Z951 Presence of aortocoronary bypass graft: Secondary | ICD-10-CM

## 2020-03-08 DIAGNOSIS — I10 Essential (primary) hypertension: Secondary | ICD-10-CM

## 2020-03-08 NOTE — Telephone Encounter (Signed)
Requested  medications are  due for refill today unsure  Requested medications are on the active medication list Yes  Last refill unsure  Future visit scheduled 5/10 when insurance becomes effective  Notes to clinic looks like you rx 30 days worth one week ago. His appt is 5/10 so the 3/11 30 day will be out before then.

## 2020-03-09 ENCOUNTER — Other Ambulatory Visit: Payer: Self-pay | Admitting: Family Medicine

## 2020-04-24 ENCOUNTER — Ambulatory Visit: Payer: Managed Care, Other (non HMO) | Admitting: Osteopathic Medicine

## 2020-07-26 IMAGING — DX DG KNEE COMPLETE 4+V*R*
3 series · 3 of 3 positions shown · non-contrast
Comparison: None.

CLINICAL DATA: 57-year-old male with bilateral knee pain for the
past 3-1/2 weeks worse in the past 2 weeks. No known injury. Initial
encounter.

EXAM:
RIGHT KNEE - COMPLETE 4+ VIEW

[knee ap]
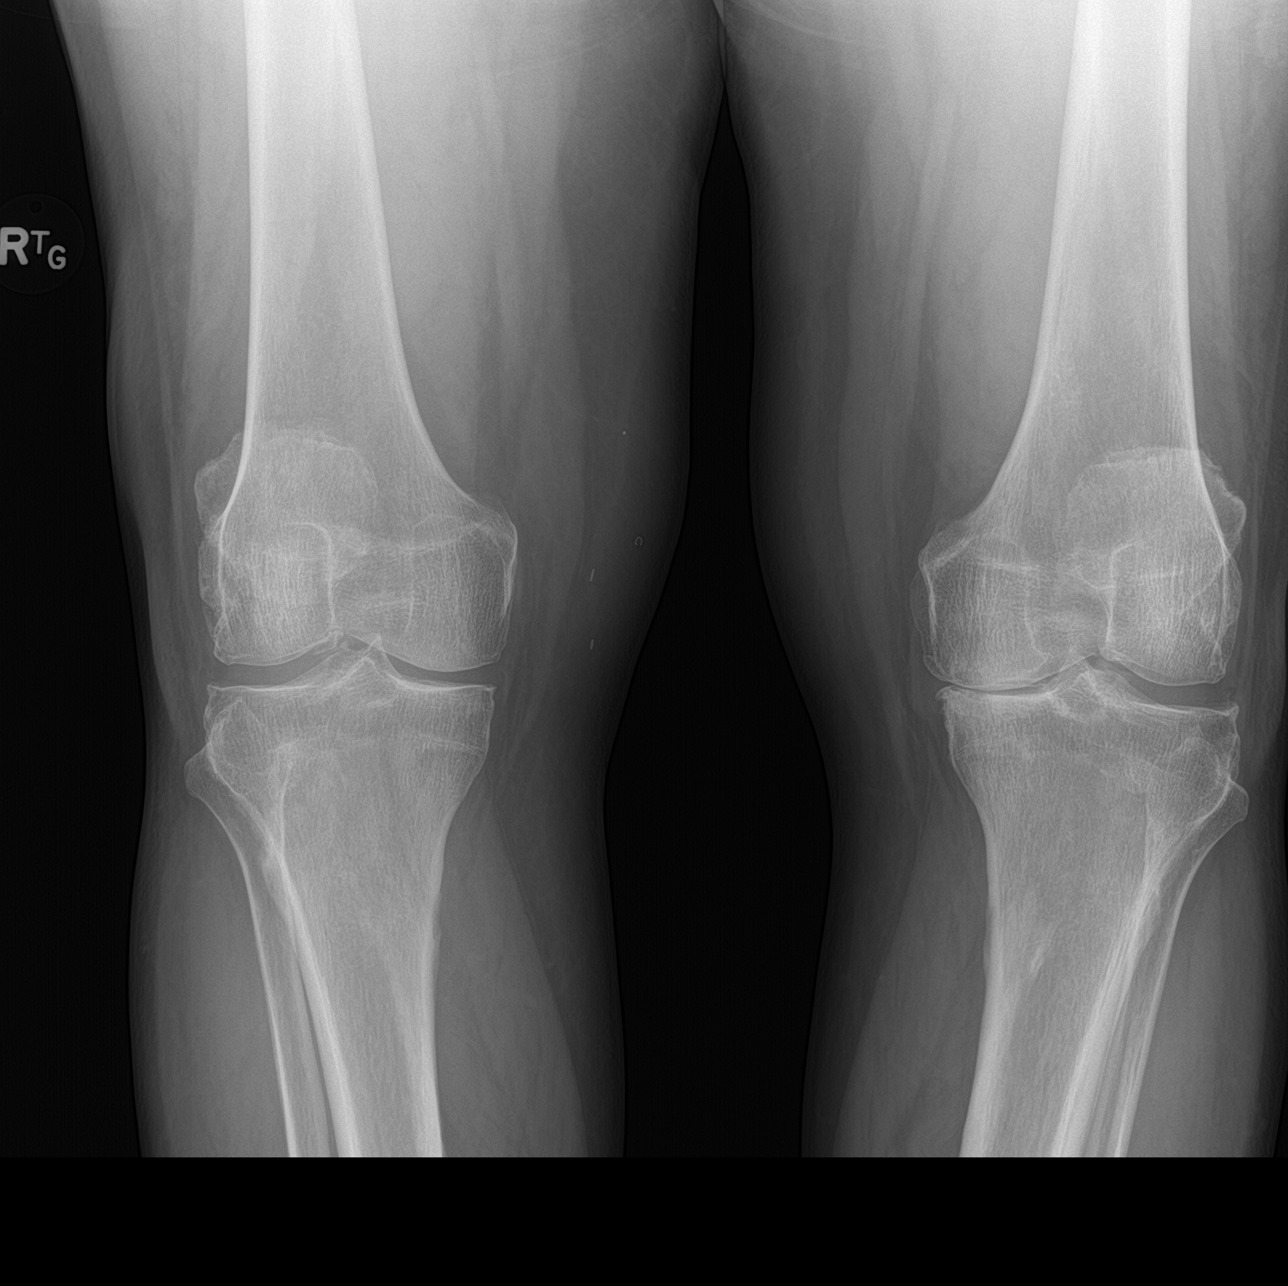

[knee lat]
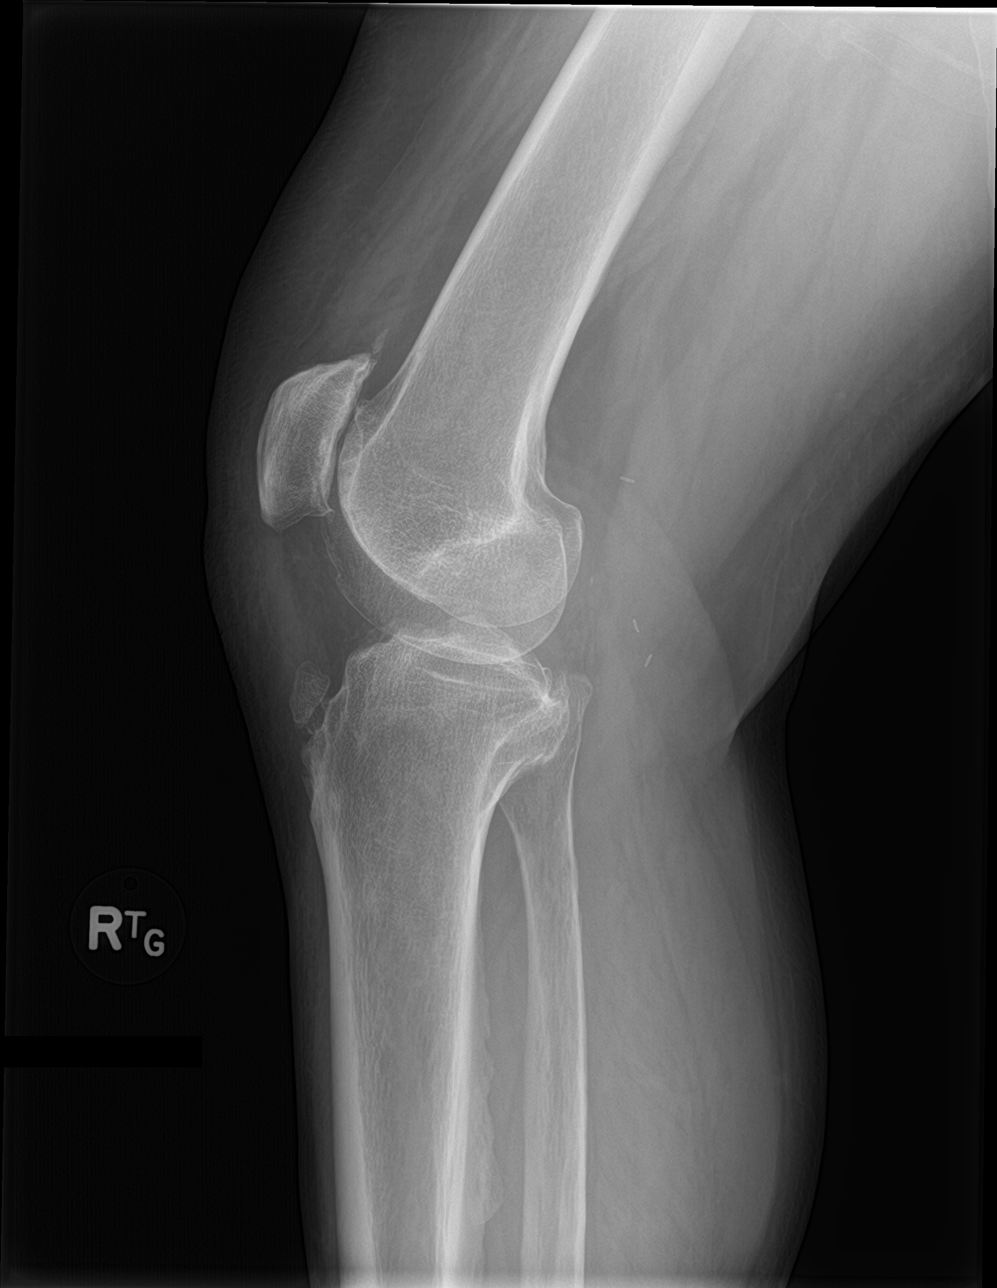

[knee sunrise]
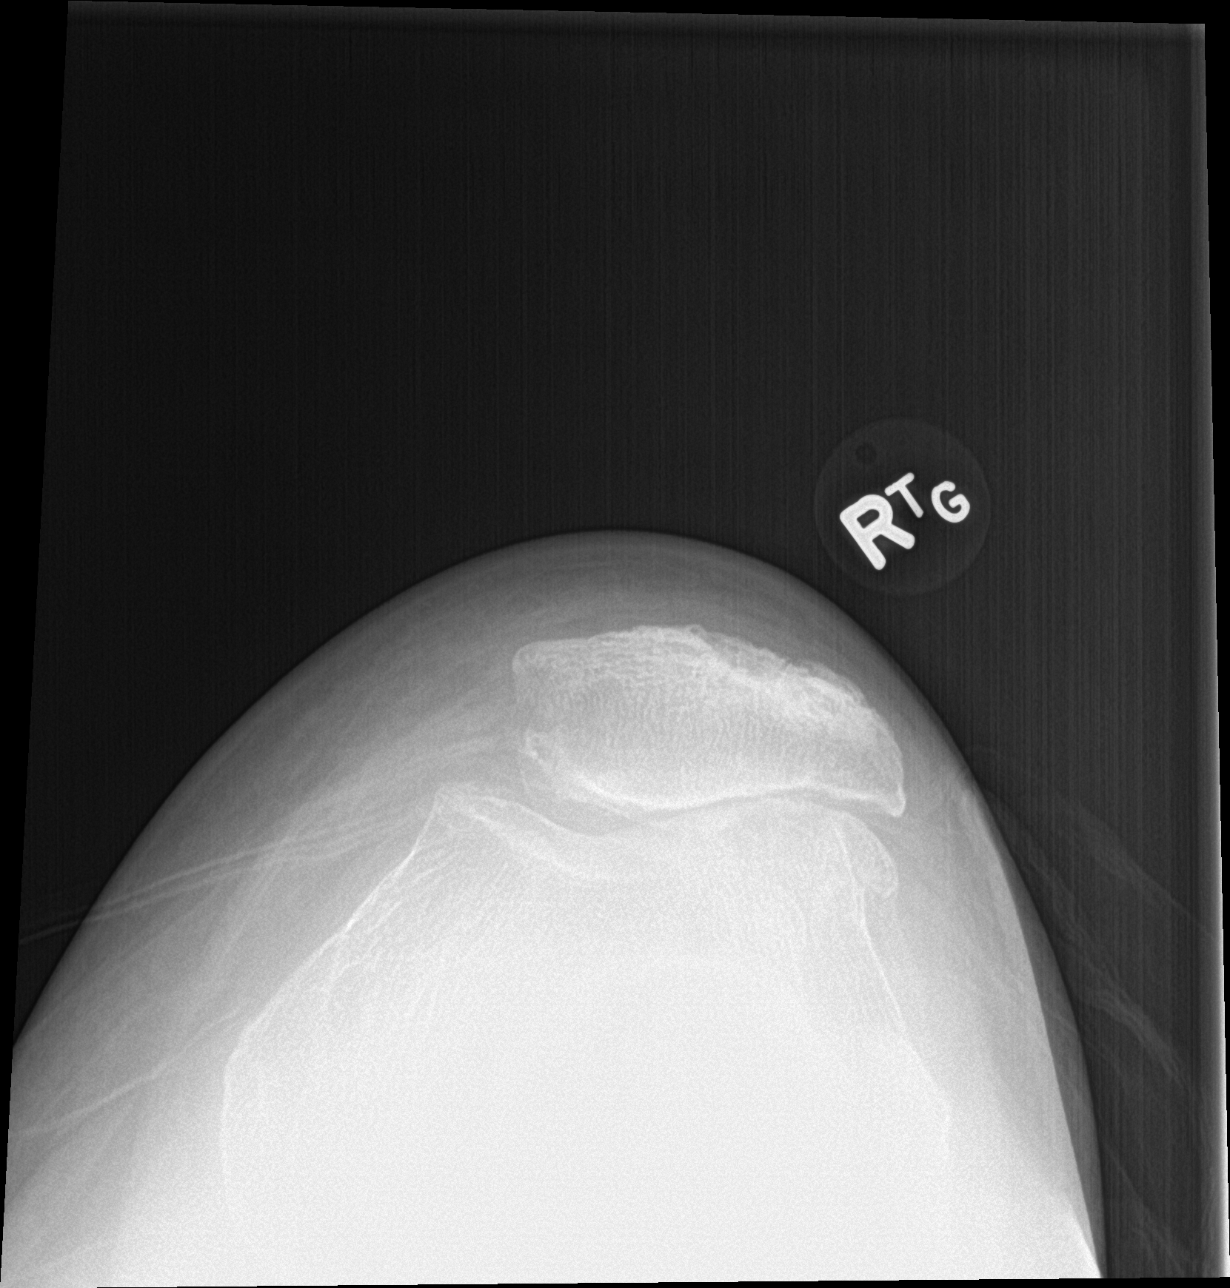

[3 of 3 positions shown; findings below may reference images not displayed]

FINDINGS: Marked patellofemoral joint degenerative changes. Mild medial
tibiofemoral joint space narrowing. Minimal lateral tibiofemoral
joint degenerative changes. No significant joint effusion (there may
be a tiny amount of joint fluid). Tiny ossific structure along the
superior margin of the patella. Moderate-sized ossific structure
tibial tuberosity consistent with prior Tajrian Pokharel disease.
Surgical clips within the soft tissue posteromedial aspect of the
knee. No fracture or dislocation.
IMPRESSION: 1. Marked patellofemoral joint degenerative changes.
2. Mild medial tibiofemoral joint space narrowing.
3. Minimal lateral tibiofemoral joint degenerative changes.
4. Evidence of prior Tajrian Pokharel disease.

## 2020-09-30 ENCOUNTER — Other Ambulatory Visit: Payer: Self-pay | Admitting: Osteopathic Medicine

## 2020-09-30 DIAGNOSIS — Z951 Presence of aortocoronary bypass graft: Secondary | ICD-10-CM

## 2020-09-30 DIAGNOSIS — I10 Essential (primary) hypertension: Secondary | ICD-10-CM

## 2020-10-03 ENCOUNTER — Ambulatory Visit (INDEPENDENT_AMBULATORY_CARE_PROVIDER_SITE_OTHER): Payer: 59 | Admitting: Osteopathic Medicine

## 2020-10-03 VITALS — BP 146/88 | HR 65 | Temp 98.0°F | Wt 299.0 lb

## 2020-10-03 DIAGNOSIS — R42 Dizziness and giddiness: Secondary | ICD-10-CM | POA: Diagnosis not present

## 2020-10-03 DIAGNOSIS — Z951 Presence of aortocoronary bypass graft: Secondary | ICD-10-CM

## 2020-10-03 DIAGNOSIS — L409 Psoriasis, unspecified: Secondary | ICD-10-CM | POA: Diagnosis not present

## 2020-10-03 DIAGNOSIS — Z8739 Personal history of other diseases of the musculoskeletal system and connective tissue: Secondary | ICD-10-CM

## 2020-10-03 MED ORDER — PRAVASTATIN SODIUM 40 MG PO TABS: 40.0000 mg | ORAL_TABLET | Freq: Every day | ORAL | 3 refills | Status: DC

## 2020-10-03 MED ORDER — SILDENAFIL CITRATE 20 MG PO TABS
20.0000 mg | ORAL_TABLET | ORAL | 3 refills | Status: DC | PRN
Start: 2020-10-03 — End: 2022-04-16

## 2020-10-03 MED ORDER — ALLOPURINOL 300 MG PO TABS
300.0000 mg | ORAL_TABLET | Freq: Every day | ORAL | 1 refills | Status: DC
Start: 2020-10-03 — End: 2021-10-15

## 2020-10-03 MED ORDER — LORATADINE 10 MG PO TABS: 10.0000 mg | ORAL_TABLET | Freq: Every day | ORAL | 1 refills | Status: DC

## 2020-10-03 MED ORDER — OMEPRAZOLE 20 MG PO CPDR
20.0000 mg | DELAYED_RELEASE_CAPSULE | Freq: Every day | ORAL | 3 refills | Status: DC
Start: 2020-10-03 — End: 2021-04-24

## 2020-10-03 MED ORDER — CLOBETASOL PROPIONATE 0.05 % EX OINT: 1.0000 "application " | TOPICAL_OINTMENT | Freq: Two times a day (BID) | CUTANEOUS | 1 refills | Status: DC

## 2020-10-03 MED ORDER — AMOXICILLIN-POT CLAVULANATE 875-125 MG PO TABS: 1.0000 | ORAL_TABLET | Freq: Two times a day (BID) | ORAL | 0 refills | Status: DC

## 2020-10-03 MED ORDER — TRIAMCINOLONE ACETONIDE 0.1 % EX CREA: 1.0000 "application " | TOPICAL_CREAM | Freq: Two times a day (BID) | CUTANEOUS | 1 refills | Status: DC

## 2020-10-03 MED ORDER — COLCHICINE 0.6 MG PO TABS: 0.6000 mg | ORAL_TABLET | Freq: Every day | ORAL | 0 refills | Status: DC

## 2020-10-03 NOTE — Patient Instructions (Addendum)
Plan:  Restarting medications: Refills sent for blood pressure medications, Viagra, acid reflux, gout.  Please note, with the gout medications, allopurinol is for long-term use, colchicine will be needed in the first 2 to 3 months on allopurinol and as needed after that for gout flare.  Psoriasis: Clobetasol is a high potency steroid to use for severe psoriasis symptoms.  Once you notice improvement, please switch over to the triamcinolone.  Remember emollient lotions after every shower, over-the-counter brands include CeraVe, Aveeno, pretty much anything that says it will treat eczema is also good for psoriasis.  Sinus/vertigo: I think dizziness symptoms are attributed possibly to a couple of different things.  I suspect to have some component of orthostatic hypotension, meaning that your blood pressure drops a bit when you stand.  Dehydration can make this worse.  Be sure to be drinking plenty of fluids on a daily basis, enough to make the urine pale yellow but not clear.  Dizziness may also be due to benign recurrent vertigo, antihistamines may help this, I sent some into the pharmacy for you.  If dizziness persists/worsens, we may consider consultation with a neurologist

## 2020-10-03 NOTE — Progress Notes (Signed)
Jon Mcbride is a 59 y.o. male who presents to  [facility]  today, 10/03/20, seeking care for the following:   intermittent dizziness, sinus pressure ongoing for about a week, OTC Rx not helping, no vision change, no dental pain, (+)ear fullness. Current diziness feels a bit different from previous ER visits.   Records reviewed: ED visits - 12/20/19 - same-day onset sudden dizziness/ataxia, CT/CTA/MRI normal, neg stroke w/u. 06/2020 - 1 wk dizziness worse w/ standing, concern about dehydration. CT/CTA/MRI neg.  Dix-Hallpike maneuver negative, no nystagmus, grossly normal neuro exam, CTA BL, RRR with normal S1-S2 . Was out of insurance for some time, needs to restart a few of his medications, he states that he was able to stay on his antihypertensives but really not much else and psoriasis has gotten pretty bad     ASSESSMENT & PLAN with other pertinent findings:  The primary encounter diagnosis was Vertigo. Diagnoses of Hx of CABG, History of gout, and Psoriasis were also pertinent to this visit.     Patient Instructions  Plan:  Restarting medications: Refills sent for blood pressure medications, Viagra, acid reflux, gout.  Please note, with the gout medications, allopurinol is for long-term use, colchicine will be needed in the first 2 to 3 months on allopurinol and as needed after that for gout flare.  Psoriasis: Clobetasol is a high potency steroid to use for severe psoriasis symptoms.  Once you notice improvement, please switch over to the triamcinolone.  Remember emollient lotions after every shower, over-the-counter brands include CeraVe, Aveeno, pretty much anything that says it will treat eczema is also good for psoriasis.  Sinus/vertigo: I think dizziness symptoms are attributed possibly to a couple of different things.  I suspect to have some component of orthostatic hypotension, meaning that your blood pressure  drops a bit when you stand.  Dehydration can make this worse.  Be sure to be drinking plenty of fluids on a daily basis, enough to make the urine pale yellow but not clear.  Dizziness may also be due to benign recurrent vertigo, antihistamines may help this, I sent some into the pharmacy for you.  If dizziness persists/worsens, we may consider consultation with a neurologist      No orders of the defined types were placed in this encounter.   Meds ordered this encounter  Medications  . amoxicillin-clavulanate (AUGMENTIN) 875-125 MG tablet    Sig: Take 1 tablet by mouth 2 (two) times daily.    Dispense:  14 tablet    Refill:  0  . clobetasol ointment (TEMOVATE) 0.05 %    Sig: Apply 1 application topically 2 (two) times daily. SEVERE PSORIASIS    Dispense:  120 g    Refill:  1  . triamcinolone cream (KENALOG) 0.1 %    Sig: Apply 1 application topically 2 (two) times daily. MILD/MODERATE PSORIASIS    Dispense:  453.6 g    Refill:  1  . loratadine (CLARITIN) 10 MG tablet    Sig: Take 1 tablet (10 mg total) by mouth daily.    Dispense:  90 tablet    Refill:  1  . sildenafil (REVATIO) 20 MG tablet    Sig: Take 1-5 tablets (20-100 mg total) by mouth as needed (take prior to sex. Use lowest effective dose). DO NOT TAKE WITH NITRATES    Dispense:  50 tablet    Refill:  3  . pravastatin (PRAVACHOL) 40 MG tablet  Sig: Take 1 tablet (40 mg total) by mouth daily.    Dispense:  90 tablet    Refill:  3  . omeprazole (PRILOSEC) 20 MG capsule    Sig: Take 1 capsule (20 mg total) by mouth daily.    Dispense:  90 capsule    Refill:  3  . colchicine (COLCRYS) 0.6 MG tablet    Sig: Take 1 tablet (0.6 mg total) by mouth daily. WITH ALLOPURINOL TO PREVENT GOUT FLARE    Dispense:  90 tablet    Refill:  0  . allopurinol (ZYLOPRIM) 300 MG tablet    Sig: Take 1 tablet (300 mg total) by mouth daily. To lower uric acid / prevent gout    Dispense:  90 tablet    Refill:  1       Follow-up  instructions: Return in about 6 months (around 04/03/2021) for ANNUAL (call week prior to visit for lab orders).                                         BP (!) 146/88 (BP Location: Left Arm, Patient Position: Sitting, Cuff Size: Large)   Pulse 65   Temp 98 F (36.7 C) (Oral)   Wt 299 lb 0.6 oz (135.6 kg)   BMI 38.39 kg/m   Current Meds  Medication Sig  . allopurinol (ZYLOPRIM) 300 MG tablet Take 1 tablet (300 mg total) by mouth daily. To lower uric acid / prevent gout  . aspirin 81 MG EC tablet Take 1 tablet (81 mg total) by mouth daily.  . colchicine (COLCRYS) 0.6 MG tablet Take 1 tablet (0.6 mg total) by mouth daily. WITH ALLOPURINOL TO PREVENT GOUT FLARE  . irbesartan (AVAPRO) 75 MG tablet TAKE 1 TABLET (75 MG TOTAL) BY MOUTH DAILY. NEED APPOINTMENT  . metoprolol succinate (TOPROL-XL) 25 MG 24 hr tablet TAKE 1 TABLET (25 MG TOTAL) BY MOUTH DAILY. NO REFILLS. MUST KEEP UPCOMING APPOINTMENT.  Marland Kitchen omeprazole (PRILOSEC) 20 MG capsule Take 1 capsule (20 mg total) by mouth daily.  . pravastatin (PRAVACHOL) 40 MG tablet Take 1 tablet (40 mg total) by mouth daily.  . sildenafil (REVATIO) 20 MG tablet Take 1-5 tablets (20-100 mg total) by mouth as needed (take prior to sex. Use lowest effective dose). DO NOT TAKE WITH NITRATES  . [DISCONTINUED] allopurinol (ZYLOPRIM) 100 MG tablet TAKE 1 TABLET (100 MG TOTAL) BY MOUTH DAILY. TO LOWER URIC ACID NEEDS APPOINTMENT  . [DISCONTINUED] allopurinol (ZYLOPRIM) 300 MG tablet Take 1 tablet (300 mg total) by mouth daily. To lower uric acid  . [DISCONTINUED] COLCRYS 0.6 MG tablet TAKE 1 TABLET BY MOUTH DAILY TO PREVENT GOUT FLAIR  . [DISCONTINUED] doxycycline (VIBRAMYCIN) 100 MG capsule Take 1 capsule (100 mg total) by mouth 2 (two) times daily. One po bid x 7 days  . [DISCONTINUED] omeprazole (PRILOSEC) 20 MG capsule Take 1 capsule (20 mg total) by mouth daily.  . [DISCONTINUED] pravastatin (PRAVACHOL) 40 MG tablet TAKE 1  TABLET (40 MG TOTAL) BY MOUTH DAILY. NEED APPOINTMENT  . [DISCONTINUED] sildenafil (REVATIO) 20 MG tablet Take 1-5 tablets (20-100 mg total) by mouth as needed (take prior to sex. Use lowest effective dose). DO NOT TAKE WITH NITRATES    No results found for this or any previous visit (from the past 72 hour(s)).  No results found.     All questions at time of visit were  answered - patient instructed to contact office with any additional concerns or updates.  ER/RTC precautions were reviewed with the patient as applicable.   Please note: voice recognition software was used to produce this document, and typos may escape review. Please contact Dr. Lyn Hollingshead for any needed clarifications.

## 2020-12-26 ENCOUNTER — Emergency Department
Admission: EM | Admit: 2020-12-26 | Discharge: 2020-12-26 | Disposition: A | Discharge status: Home / Self Care | Payer: 59 | Source: Home / Self Care

## 2020-12-26 ENCOUNTER — Telehealth: Payer: Self-pay

## 2020-12-26 ENCOUNTER — Other Ambulatory Visit: Payer: Self-pay

## 2020-12-26 ENCOUNTER — Emergency Department (INDEPENDENT_AMBULATORY_CARE_PROVIDER_SITE_OTHER): Payer: 59

## 2020-12-26 DIAGNOSIS — U071 COVID-19: Secondary | ICD-10-CM

## 2020-12-26 DIAGNOSIS — R059 Cough, unspecified: Secondary | ICD-10-CM

## 2020-12-26 DIAGNOSIS — J208 Acute bronchitis due to other specified organisms: Secondary | ICD-10-CM

## 2020-12-26 MED ORDER — PREDNISONE 20 MG PO TABS: 40.0000 mg | ORAL_TABLET | Freq: Every day | ORAL | 0 refills | Status: DC

## 2020-12-26 MED ORDER — DOXYCYCLINE HYCLATE 100 MG PO CAPS: 100.0000 mg | ORAL_CAPSULE | Freq: Two times a day (BID) | ORAL | 0 refills | Status: DC

## 2020-12-26 MED ORDER — BENZONATATE 100 MG PO CAPS: 200.0000 mg | ORAL_CAPSULE | Freq: Three times a day (TID) | ORAL | 0 refills | Status: DC | PRN

## 2020-12-26 NOTE — Telephone Encounter (Signed)
Pt called stating he is having issues with post Covid symptoms. Per pt, still not feeling well. Pt went to the ER today for an evaluation.   Please contact patient to schedule a virtual f/u appointment with provider. Thanks in advance.

## 2020-12-26 NOTE — Discharge Instructions (Addendum)
Treating today for acute bronchitis which is likely secondary to recent diagnosis and COVID-19.  Will prescribe prednisone 40 mg once daily for 5 days.  Start benzonatate Perles 200 mg 3 times daily as needed for cough.  Doxycycline 100 mg twice daily for 7 days.

## 2020-12-26 NOTE — ED Provider Notes (Signed)
[name]    CSN: [id number] Arrival date & time: 12/26/20  1043      History   Chief Complaint Chief Complaint  Patient presents with  . Cough  . Nasal Congestion    HPI Jon Mcbride is a 61 y.o. male.   HPI  Patient presents today for evaluation of cough and chest congestion following a diagnosis of COVID over a week ago.  Patient has had a cough for over a month but only tested positive a week ago and reports taking a antigen test and subsequently testing negative.  He is concerned for possible pneumonia.  Patient has a significant history of cardiovascular disease involving quadruple bypass.  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  He is afebrile and mostly concerned with the persistent cough which induces some mild shortness of breath however no wheezing or shortness of breath when not coughing.  He also denies any chest pain.  Past Medical History:  Diagnosis Date  . Gout   . Heart attack (HCC)   . Hypertension     Patient Active Problem List   Diagnosis Date Noted  . Acute gout of left wrist 04/12/2019  . Psoriasis 11/06/2018  . Bilateral primary osteoarthritis of knee 10/07/2018  . Morbid obesity (HCC) 10/06/2018  . Obstructive sleep apnea 02/26/2018  . 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 HTN (hypertension), benign 01/01/2013  . Stented coronary artery 01/01/2013    Past Surgical History:  Procedure Laterality Date  . CAROTID STENT INSERTION         Home Medications    Prior to Admission medications   Medication Sig Start Date End Date Taking? Authorizing Provider  allopurinol (ZYLOPRIM) 300 MG tablet Take 1 tablet (300 mg total) by mouth daily. To lower uric acid / prevent gout 10/03/20   Sunnie Nielsen, DO  amoxicillin-clavulanate (AUGMENTIN) 875-125 MG tablet Take 1 tablet by mouth 2 (two) times daily. 10/03/20   Sunnie Nielsen, DO  aspirin 81 MG EC tablet Take 1 tablet  (81 mg total) by mouth daily. 08/31/18   Sunnie Nielsen, DO  clobetasol ointment (TEMOVATE) 0.05 % Apply 1 application topically 2 (two) times daily. SEVERE PSORIASIS 10/03/20   Sunnie Nielsen, DO  colchicine (COLCRYS) 0.6 MG tablet Take 1 tablet (0.6 mg total) by mouth daily. WITH ALLOPURINOL TO PREVENT GOUT FLARE 10/03/20 01/01/21  Sunnie Nielsen, DO  irbesartan (AVAPRO) 75 MG tablet TAKE 1 TABLET (75 MG TOTAL) BY MOUTH DAILY. NEED APPOINTMENT 11/15/19   Sunnie Nielsen, DO  loratadine (CLARITIN) 10 MG tablet Take 1 tablet (10 mg total) by mouth daily. 10/03/20   Sunnie Nielsen, DO  metoprolol succinate (TOPROL-XL) 25 MG 24 hr tablet TAKE 1 TABLET (25 MG TOTAL) BY MOUTH DAILY. NO REFILLS. MUST KEEP UPCOMING APPOINTMENT. 10/03/20   Sunnie Nielsen, DO  omeprazole (PRILOSEC) 20 MG capsule Take 1 capsule (20 mg total) by mouth daily. 10/03/20   Sunnie Nielsen, DO  pravastatin (PRAVACHOL) 40 MG tablet Take 1 tablet (40 mg total) by mouth daily. 10/03/20   Sunnie Nielsen, DO  sildenafil (REVATIO) 20 MG tablet Take 1-5 tablets (20-100 mg total) by mouth as needed (take prior to sex. Use lowest effective dose). DO NOT TAKE WITH NITRATES 10/03/20   Sunnie Nielsen, DO  triamcinolone cream (KENALOG) 0.1 % Apply 1 application topically 2 (two) times daily. MILD/MODERATE PSORIASIS 10/03/20   Sunnie Nielsen, DO    Family History Family History  Problem Relation  Age of Onset  . Hypertension Father   . Hyperlipidemia Father   . Cancer Mother   . Hyperlipidemia Mother     Social History Social History   Tobacco Use  . Smoking status: Never Smoker  . Smokeless tobacco: Never Used  Substance Use Topics  . Alcohol use: No  . Drug use: No     Allergies   Patient has no known allergies.   Review of Systems Review of Systems Pertinent negatives listed in HPI   Physical Exam Triage Vital Signs ED Triage Vitals  Enc Vitals Group     BP 12/26/20 1156 (!)  150/88     Pulse Rate 12/26/20 1156 (!) 59     Resp 12/26/20 1156 17     Temp 12/26/20 1156 97.6 F (36.4 C)     Temp Source 12/26/20 1156 Oral     SpO2 12/26/20 1156 97 %     Weight --      Height --      Head Circumference --      Peak Flow --      Pain Score 12/26/20 1158 0     Pain Loc --      Pain Edu? --      Excl. in GC? --    No data found.  Updated Vital Signs BP (!) 150/88 (BP Location: Left Arm)   Pulse (!) 59   Temp 97.6 F (36.4 C) (Oral)   Resp 17   SpO2 97%   Visual Acuity Right Eye Distance:   Left Eye Distance:   Bilateral Distance:    Right Eye Near:   Left Eye Near:    Bilateral Near:     Physical Exam General appearance: alert, Ill-appearing, no distress Head: Normocephalic, without obvious abnormality, atraumatic ENT: mucosal edema, congestion, erythematous oropharynx w/o exudate Respiratory: Respirations even , unlabored, coarse lung sound of the right upper left, negative for rales, wheezes, crackles Heart: rate and rhythm normal. No gallop or murmurs noted on exam  Abdomen: BS +, no distention, no rebound tenderness, or no mass Extremities: No gross deformities Skin: Skin color, texture, turgor normal. No rashes seen  Psych: Appropriate mood and affect. Neurologic: Mental status: Alert, oriented to person, place, and time, thought content appropriate.   UC Treatments / Results  Labs (all labs ordered are listed, but only abnormal results are displayed) Labs Reviewed - No data to display  EKG   Radiology DG Chest 2 View  Result Date: 12/26/2020 CLINICAL DATA:  36-year-old male with cough for 1 month. EXAM: CHEST - 2 VIEW COMPARISON:  09/08/2019 FINDINGS: The heart size and mediastinal contours are within normal limits. Status post coronary artery bypass graft. Unchanged elevation of the right hemidiaphragm. Both lungs are clear. No acute osseous abnormality. IMPRESSION: 1. No acute cardiopulmonary process. 2. Unchanged chronic right  hemidiaphragm elevation. Electronically Signed   By: Marliss Coots MD   On: 12/26/2020 12:44    Procedures Procedures (including critical care time)  Medications Ordered in UC Medications - No data to display  Initial Impression / Assessment and Plan / UC Course  I have reviewed the triage vital signs and the nursing notes.  Pertinent labs & imaging results that were available during my care of the patient were reviewed by me and considered in my medical decision making (see chart for details).     Recent history of COVID-19 treating today for secondary acute bronchitis.  Chest x-ray negative for pneumonia.  Treatment per discharge  instructions.  Patient advised if any of his symptoms worsen or he develops any increased work of breathing, wheezing or severe chest tightness or severe shortness of breath to go immediately to the closest emergency department for further evaluation.  Vital signs are stable and reassuring.  Lung exam mild coarseness heard in the upper lung fields bilaterally otherwise unremarkable. Final Clinical Impressions(s) / UC Diagnoses   Final diagnoses:  Acute bronchitis due to COVID-19 virus     Discharge Instructions     Treating today for acute bronchitis which is likely secondary to recent diagnosis and COVID-19.  Will prescribe prednisone 40 mg once daily for 5 days.  Start benzonatate Perles 200 mg 3 times daily as needed for cough.  Doxycycline 100 mg twice daily for 7 days.    ED Prescriptions    Medication Sig Dispense Auth. Provider   doxycycline (VIBRAMYCIN) 100 MG capsule Take 1 capsule (100 mg total) by mouth 2 (two) times daily. 20 capsule Bing Neighbors, FNP   predniSONE (DELTASONE) 20 MG tablet Take 2 tablets (40 mg total) by mouth daily with breakfast. 10 tablet Bing Neighbors, FNP   benzonatate (TESSALON) 100 MG capsule Take 2 capsules (200 mg total) by mouth 3 (three) times daily as needed for cough. 40 capsule Bing Neighbors, FNP      PDMP not reviewed this encounter.   Bing Neighbors, FNP 12/26/20 1309

## 2020-12-26 NOTE — ED Triage Notes (Signed)
Pt c/o cough and congestion x 44mos - COVID pos last Tues due to work testing requirements. Says covid made cough worse. Worried about pneumonia. Mucinex prn. Also hx of bronchitis.

## 2021-04-24 ENCOUNTER — Ambulatory Visit: Payer: 59 | Admitting: Osteopathic Medicine

## 2021-04-24 ENCOUNTER — Other Ambulatory Visit: Payer: Self-pay

## 2021-04-24 ENCOUNTER — Encounter: Payer: Self-pay | Admitting: Osteopathic Medicine

## 2021-04-24 VITALS — BP 146/76 | HR 62 | Temp 98.4°F | Wt 303.0 lb

## 2021-04-24 DIAGNOSIS — I10 Essential (primary) hypertension: Secondary | ICD-10-CM

## 2021-04-24 DIAGNOSIS — R5382 Chronic fatigue, unspecified: Secondary | ICD-10-CM

## 2021-04-24 DIAGNOSIS — Z951 Presence of aortocoronary bypass graft: Secondary | ICD-10-CM

## 2021-04-24 DIAGNOSIS — G4733 Obstructive sleep apnea (adult) (pediatric): Secondary | ICD-10-CM

## 2021-04-24 MED ORDER — METOPROLOL SUCCINATE ER 25 MG PO TB24
25.0000 mg | ORAL_TABLET | Freq: Every day | ORAL | 3 refills | Status: DC
Start: 2021-04-24 — End: 2021-11-30

## 2021-04-24 MED ORDER — IRBESARTAN 75 MG PO TABS: 75.0000 mg | ORAL_TABLET | Freq: Every day | ORAL | 3 refills | Status: DC

## 2021-04-24 NOTE — Patient Instructions (Signed)
Plan: Labs today Refill BP meds If labs all good, will sent Rx for weight loss If labs not so good, will call you and go from there!

## 2021-04-24 NOTE — Progress Notes (Signed)
Jon Mcbride is a 60 y.o. male who presents to  [facility] at [facility]  today, 04/24/21, seeking care for the following:  Fatigue ongoing a few months. Feels overall ok and has an active job but daytime somnolence at random and significant fatigue when he gets home. No CP on exertion but sometimes will have SOB if significant exertion. No weight loss. Ortho pain precludes walking for exercise anything beyond his job. COVID diagnosed 12/2020, hx CAD s/p CABG x4. Hx OSA not on CPAP. Not taking BB or ARB at this time.      Wt Readings from Last 3 Encounters:  04/24/21 (!) 303 lb 0.6 oz (137.5 kg)  10/03/20 299 lb 0.6 oz (135.6 kg)  09/08/19 290 lb (131.5 kg)     ASSESSMENT & PLAN with other pertinent findings:  The primary encounter diagnosis was Chronic fatigue. Diagnoses of HTN (hypertension), benign, Obstructive sleep apnea, History of quadruple bypass, Morbid obesity (HCC), and Hx of CABG were also pertinent to this visit.   --> needs consistent use BP meds --> statin intolerance, consider PCSK9 based on LDL  --> needs ASA --> labs and sleep study to w/u fatigue --> possible low Testosterone but wouldn't replace this given hx CAD and not on optimal secondary prevention  --> obesity w/ significant comorbids, low threshold for GLP1 Rx to help with weight loss   Patient Instructions  Plan: Labs today Refill BP meds If labs all good, will sent Rx for weight loss If labs not so good, will call you and go from there!      Orders Placed This Encounter  Procedures  . CBC with Differential/Platelet  . COMPLETE METABOLIC PANEL WITH GFR  . Lipid panel  . TSH  . Hemoglobin A1c  . PSA, Total with Reflex to PSA, Free  . VITAMIN D 25 Hydroxy (Vit-D Deficiency, Fractures)  . Urinalysis  . Home sleep test    No orders of the defined types were placed in this encounter.    See below for relevant physical exam findings  See below  for recent lab and imaging results reviewed  Medications, allergies, PMH, PSH, SocH, FamH reviewed below    Follow-up instructions: Return for RECHECK PENDING RESULTS / IF WORSE OR CHANGE.                                        Exam:  BP (!) 146/76 (BP Location: Left Arm, Patient Position: Sitting, Cuff Size: Large)   Pulse 62   Temp 98.4 F (36.9 C) (Oral)   Wt (!) 303 lb 0.6 oz (137.5 kg)   BMI 38.91 kg/m   Constitutional: VS see above. General Appearance: alert, well-developed, well-nourished, NAD  Neck: No masses, trachea midline.   Respiratory: Normal respiratory effort. no wheeze, no rhonchi, no rales  Cardiovascular: S1/S2 normal, no murmur, no rub/gallop auscultated. RRR.   Musculoskeletal: Gait normal. Symmetric and independent movement of all extremities  Abdominal: non-tender, non-distended, no appreciable organomegaly, neg Murphy's, BS WNLx4  Neurological: Normal balance/coordination. No tremor.  Skin: warm, dry, intact.   Psychiatric: Normal judgment/insight. Normal mood and affect. Oriented x3.   Current Meds  Medication Sig  . allopurinol (ZYLOPRIM) 300 MG tablet Take 1 tablet (300 mg total) by mouth daily. To lower uric acid / prevent gout  . aspirin 81 MG EC tablet Take 1 tablet (81 mg  total) by mouth daily.  . clobetasol ointment (TEMOVATE) 0.05 % Apply 1 application topically 2 (two) times daily. SEVERE PSORIASIS  . irbesartan (AVAPRO) 75 MG tablet TAKE 1 TABLET (75 MG TOTAL) BY MOUTH DAILY. NEED APPOINTMENT  . loratadine (CLARITIN) 10 MG tablet Take 1 tablet (10 mg total) by mouth daily.  . metoprolol succinate (TOPROL-XL) 25 MG 24 hr tablet TAKE 1 TABLET (25 MG TOTAL) BY MOUTH DAILY. NO REFILLS. MUST KEEP UPCOMING APPOINTMENT.  Marland Kitchen omeprazole (PRILOSEC) 20 MG capsule Take 1 capsule (20 mg total) by mouth daily.  . pravastatin (PRAVACHOL) 40 MG tablet Take 1 tablet (40 mg total) by mouth daily.  . sildenafil (REVATIO)  20 MG tablet Take 1-5 tablets (20-100 mg total) by mouth as needed (take prior to sex. Use lowest effective dose). DO NOT TAKE WITH NITRATES  . triamcinolone cream (KENALOG) 0.1 % Apply 1 application topically 2 (two) times daily. MILD/MODERATE PSORIASIS  . [DISCONTINUED] benzonatate (TESSALON) 100 MG capsule Take 2 capsules (200 mg total) by mouth 3 (three) times daily as needed for cough.  . [DISCONTINUED] doxycycline (VIBRAMYCIN) 100 MG capsule Take 1 capsule (100 mg total) by mouth 2 (two) times daily.  . [DISCONTINUED] predniSONE (DELTASONE) 20 MG tablet Take 2 tablets (40 mg total) by mouth daily with breakfast.    No Known Allergies  Patient Active Problem List   Diagnosis Date Noted  . Acute gout of left wrist 04/12/2019  . Psoriasis 11/06/2018  . Bilateral primary osteoarthritis of knee 10/07/2018  . Morbid obesity (HCC) 10/06/2018  . Obstructive sleep apnea 02/26/2018  . 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 HTN (hypertension), benign 01/01/2013  . Stented coronary artery 01/01/2013    Family History  Problem Relation Age of Onset  . Hypertension Father   . Hyperlipidemia Father   . Cancer Mother   . Hyperlipidemia Mother     Social History   Tobacco Use  Smoking Status Never Smoker  Smokeless Tobacco Never Used    Past Surgical History:  Procedure Laterality Date  . CAROTID STENT INSERTION      Immunization History  Administered Date(s) Administered  . Influenza,inj,Quad PF,6+ Mos 12/29/2017  . Influenza-Unspecified 01/01/2013  . Tdap 01/01/2013    No results found for this or any previous visit (from the past 2160 hour(s)).  No results found.     All questions at time of visit were answered - patient instructed to contact office with any additional concerns or updates. ER/RTC precautions were reviewed with the patient as applicable.   Please note: manual typing as well as voice recognition software may have been used to produce this document -  typos may escape review. Please contact Dr. Lyn Hollingshead for any needed clarifications.

## 2021-04-27 LAB — COMPLETE METABOLIC PANEL WITH GFR
AG Ratio: 1.3 (calc) (ref 1.0–2.5)
ALT: 20 U/L (ref 9–46)
AST: 21 U/L (ref 10–35)
Albumin: 4.1 g/dL (ref 3.6–5.1)
Alkaline phosphatase (APISO): 60 U/L (ref 35–144)
BUN: 13 mg/dL (ref 7–25)
CO2: 28 mmol/L (ref 20–32)
Calcium: 9.4 mg/dL (ref 8.6–10.3)
Chloride: 102 mmol/L (ref 98–110)
Creat: 0.9 mg/dL (ref 0.70–1.25)
GFR, Est African American: 107 mL/min/{1.73_m2} (ref >=60)
GFR, Est Non African American: 93 mL/min/{1.73_m2} (ref >=60)
Globulin: 3.2 g/dL (calc) (ref 1.9–3.7)
Glucose, Bld: 95 mg/dL (ref 65–99)
Potassium: 4.5 mmol/L (ref 3.5–5.3)
Sodium: 138 mmol/L (ref 135–146)
Total Bilirubin: 0.7 mg/dL (ref 0.2–1.2)
Total Protein: 7.3 g/dL (ref 6.1–8.1)

## 2021-04-27 LAB — URINALYSIS
Bilirubin Urine: NEGATIVE
Glucose, UA: NEGATIVE
Hgb urine dipstick: NEGATIVE
Ketones, ur: NEGATIVE
Leukocytes,Ua: NEGATIVE
Nitrite: NEGATIVE
Protein, ur: NEGATIVE
Specific Gravity, Urine: 1.016 (ref 1.001–1.035)
pH: 6 (ref 5.0–8.0)

## 2021-04-27 LAB — CBC WITH DIFFERENTIAL/PLATELET
Absolute Monocytes: 759 cells/uL (ref 200–950)
Basophils Absolute: 73 cells/uL (ref 0–200)
Basophils Relative: 1 %
Eosinophils Absolute: 314 cells/uL (ref 15–500)
Eosinophils Relative: 4.3 %
HCT: 44.9 % (ref 38.5–50.0)
Hemoglobin: 14.9 g/dL (ref 13.2–17.1)
Lymphs Abs: 2446 cells/uL (ref 850–3900)
MCH: 29.9 pg (ref 27.0–33.0)
MCHC: 33.2 g/dL (ref 32.0–36.0)
MCV: 90.2 fL (ref 80.0–100.0)
MPV: 10.4 fL (ref 7.5–12.5)
Monocytes Relative: 10.4 %
Neutro Abs: 3708 cells/uL (ref 1500–7800)
Neutrophils Relative %: 50.8 %
Platelets: 176 10*3/uL (ref 140–400)
RBC: 4.98 10*6/uL (ref 4.20–5.80)
RDW: 12.8 % (ref 11.0–15.0)
Total Lymphocyte: 33.5 %
WBC: 7.3 10*3/uL (ref 3.8–10.8)

## 2021-04-27 LAB — TSH: TSH: 3.71 mIU/L (ref 0.40–4.50)

## 2021-04-27 LAB — HEMOGLOBIN A1C
Hgb A1c MFr Bld: 5.7 % of total Hgb — ABNORMAL HIGH (ref <5.7)
Mean Plasma Glucose: 117 mg/dL
eAG (mmol/L): 6.5 mmol/L

## 2021-04-27 LAB — LIPID PANEL
Cholesterol: 210 mg/dL — ABNORMAL HIGH (ref <200)
HDL: 35 mg/dL — ABNORMAL LOW (ref >=40)
LDL Cholesterol (Calc): 151 mg/dL (calc) — ABNORMAL HIGH
Non-HDL Cholesterol (Calc): 175 mg/dL (calc) — ABNORMAL HIGH (ref <130)
Total CHOL/HDL Ratio: 6 (calc) — ABNORMAL HIGH (ref <5.0)
Triglycerides: 122 mg/dL (ref <150)

## 2021-04-27 LAB — PSA, TOTAL WITH REFLEX TO PSA, FREE: PSA, Total: 0.8 ng/mL (ref <=4.0)

## 2021-04-27 LAB — VITAMIN D 25 HYDROXY (VIT D DEFICIENCY, FRACTURES): Vit D, 25-Hydroxy: 24 ng/mL — ABNORMAL LOW (ref 30–100)

## 2021-05-03 MED ORDER — PRALUENT 150 MG/ML ~~LOC~~ SOAJ: 150.0000 mg | SUBCUTANEOUS | 3 refills | Status: DC

## 2021-05-03 NOTE — Addendum Note (Signed)
Addended by: Deirdre Pippins on: 05/03/2021 05:20 PM   Modules accepted: Orders

## 2021-05-04 ENCOUNTER — Telehealth: Payer: Self-pay

## 2021-05-04 NOTE — Telephone Encounter (Signed)
Prior authorization for praluent sent to the insurance. Pending a determination.

## 2021-05-15 ENCOUNTER — Telehealth: Payer: Self-pay | Admitting: General Practice

## 2021-05-15 NOTE — Telephone Encounter (Signed)
Transition Care Management Follow-up Telephone Call  Date of discharge and from where: [facility] 05/13/21  How have you been since you were released from the hospital? Doing better.  Any questions or concerns? No  Items Reviewed:  Did the pt receive and understand the discharge instructions provided? Yes   Medications obtained and verified? Yes   Other? No   Any new allergies since your discharge? No   Dietary orders reviewed? Yes  Do you have support at home? Yes   Home Care and Equipment/Supplies: Were home health services ordered? no    Functional Questionnaire: (I = Independent and D = Dependent) ADLs: I  Bathing/Dressing- I  Meal Prep- I  Eating- I  Maintaining continence- I  Transferring/Ambulation- I  Managing Meds- I  Follow up appointments reviewed:   PCP Hospital f/u appt confirmed? No  Patient didn't want to schedule a follow up at this time. He stated that he will call back to schedule an appointment if needed.  Specialist Hospital f/u appt confirmed? No    Are transportation arrangements needed? No   If their condition worsens, is the pt aware to call PCP or go to the Emergency Dept.? Yes  Was the patient provided with contact information for the PCP's office or ED? Yes  Was to pt encouraged to call back with questions or concerns? Yes

## 2021-06-19 ENCOUNTER — Other Ambulatory Visit: Payer: Self-pay | Admitting: Osteopathic Medicine

## 2021-06-22 ENCOUNTER — Encounter (HOSPITAL_BASED_OUTPATIENT_CLINIC_OR_DEPARTMENT_OTHER): Payer: 59 | Admitting: Internal Medicine

## 2021-06-29 ENCOUNTER — Encounter (HOSPITAL_BASED_OUTPATIENT_CLINIC_OR_DEPARTMENT_OTHER): Payer: 59 | Admitting: Internal Medicine

## 2021-07-02 ENCOUNTER — Other Ambulatory Visit: Payer: Self-pay

## 2021-07-02 ENCOUNTER — Ambulatory Visit (HOSPITAL_BASED_OUTPATIENT_CLINIC_OR_DEPARTMENT_OTHER): Payer: 59 | Attending: Osteopathic Medicine | Admitting: Internal Medicine

## 2021-07-02 VITALS — Ht 74.0 in | Wt 295.0 lb

## 2021-07-02 DIAGNOSIS — I10 Essential (primary) hypertension: Secondary | ICD-10-CM | POA: Insufficient documentation

## 2021-07-02 DIAGNOSIS — R0902 Hypoxemia: Secondary | ICD-10-CM | POA: Insufficient documentation

## 2021-07-02 DIAGNOSIS — R5382 Chronic fatigue, unspecified: Secondary | ICD-10-CM | POA: Diagnosis not present

## 2021-07-02 DIAGNOSIS — G4733 Obstructive sleep apnea (adult) (pediatric): Secondary | ICD-10-CM | POA: Diagnosis present

## 2021-07-07 DIAGNOSIS — R5382 Chronic fatigue, unspecified: Secondary | ICD-10-CM | POA: Diagnosis not present

## 2021-07-07 NOTE — Procedures (Signed)
   Patient Name: Jon Mcbride, Jon Mcbride Date: 07/02/2021 Gender: Male D.O.B: Jul 29, 1961 Age (years): 59 Referring Provider: Sunnie Nielsen Height (inches): 74 Interpreting Physician: Jetty Duhamel MD, ABSM Weight (lbs): 303 RPSGT: Stover Sink BMI: 39 MRN: [id number] Neck Size: <br>  CLINICAL INFORMATION Sleep Study Type: HST Indication for sleep study: OSA Epworth Sleepiness Score: 12  SLEEP STUDY TECHNIQUE A multi-channel overnight portable sleep study was performed. The channels recorded were: nasal airflow, thoracic respiratory movement, and oxygen saturation with a pulse oximetry. Snoring was also monitored.  MEDICATIONS Patient self administered medications include: none reported  SLEEP ARCHITECTURE Patient was studied for 405.2 minutes. The sleep efficiency was 100.0 % and the patient was supine for 0.1%. The arousal index was 0.0 per hour.  RESPIRATORY PARAMETERS The overall AHI was 68.9 per hour, with a central apnea index of 0 per hour. The oxygen nadir was 64% during sleep.  CARDIAC DATA Mean heart rate during sleep was 65.2 bpm.  IMPRESSIONS - Severe obstructive sleep apnea occurred during this study (AHI = 68.9/h). - Severe oxygen desaturation was noted during this study (Min O2 = 64%). Mean O2 saturation 90%. Time with O2 saturation 89% or less was 61.7 minutes. - Patient snored  DIAGNOSIS - Obstructive Sleep Apnea (G47.33) - Nocturnal Hypoxemia (G47.36)  RECOMMENDATIONS - Suggest CPAP titration sleep study or autopap. Attended in-center study will provide insurance documentation if supplemental O2 is also indicated. - Be careful with alcohol, sedatives and other CNS depressants that may worsen sleep apnea and disrupt normal sleep architecture. - Sleep hygiene should be reviewed to assess factors that may improve sleep quality. - Weight management and regular exercise should be initiated or continued.  [Electronically signed] 07/07/2021 11:04  AM  Jetty Duhamel MD, ABSM Diplomate, American Board of Sleep Medicine   NPI: [id number]                          Jetty Duhamel Diplomate, American Board of Sleep Medicine  ELECTRONICALLY SIGNED ON:  07/07/2021, 11:00 AM [facility] PH: [phone]   FX: [phone] ACCREDITED BY THE AMERICAN ACADEMY OF SLEEP MEDICINE

## 2021-07-11 MED ORDER — AMBULATORY NON FORMULARY MEDICATION: 99 refills | Status: DC

## 2021-07-11 NOTE — Addendum Note (Signed)
Addended by: Deirdre Pippins on: 07/11/2021 01:38 PM   Modules accepted: Orders

## 2021-07-24 ENCOUNTER — Other Ambulatory Visit: Payer: Self-pay

## 2021-07-24 DIAGNOSIS — Z951 Presence of aortocoronary bypass graft: Secondary | ICD-10-CM

## 2021-07-24 DIAGNOSIS — G4733 Obstructive sleep apnea (adult) (pediatric): Secondary | ICD-10-CM

## 2021-07-24 MED ORDER — AMBULATORY NON FORMULARY MEDICATION: 99 refills | Status: DC

## 2021-08-16 ENCOUNTER — Telehealth: Payer: Self-pay

## 2021-08-16 NOTE — Telephone Encounter (Signed)
FYI - Patient left a vm msg requesting his sleep study results. Per patient, he has not heard back regarding his results. CPAP order with supplies was faxed to [facility] on 07/25/21.

## 2021-08-17 NOTE — Telephone Encounter (Signed)
I had several patients complain about not hearing anything from companies managing CPAP/supplies.  Not sure if patients can do CPAP results on MyChart, but would encourage him to sign up for my chart and document should be on there, or can print recent study results and mail them to him.  Whenever he wants to do!

## 2021-08-17 NOTE — Telephone Encounter (Signed)
Task completed. Per patient, he wanted to know what were the next steps regarding his sleep apnea diagnosis. Patient was informed that a CPAP/supplies order was faxed on 07/25/21 to [facility]. Patient was given contact information for [facility] to follow up with order. Patient advised to return a call back if he has any concerns/questions.

## 2021-10-14 ENCOUNTER — Other Ambulatory Visit: Payer: Self-pay | Admitting: Osteopathic Medicine

## 2021-10-16 ENCOUNTER — Other Ambulatory Visit: Payer: Self-pay | Admitting: Family Medicine

## 2021-10-16 ENCOUNTER — Telehealth: Payer: Self-pay | Admitting: Osteopathic Medicine

## 2021-10-16 MED ORDER — COLCHICINE 0.6 MG PO TABS
0.6000 mg | ORAL_TABLET | Freq: Every day | ORAL | 0 refills | Status: DC
Start: 2021-10-16 — End: 2022-04-16

## 2021-10-16 NOTE — Telephone Encounter (Signed)
Pt informed of refills.  Pt expressed understanding and is agreeable.  Tiajuana Amass, CMA

## 2021-10-16 NOTE — Telephone Encounter (Signed)
Pt scheduled a Transfer Of Care from Dr.Alexander to Dr.Matthews for December, but right now he needs his Gout Medication called in as soon as possible as he is in the middle of a gout flare. Please call patient and let him know the status. Thank you

## 2021-10-16 NOTE — Telephone Encounter (Signed)
Allopurinol appears to have been re-ordered yesterday.  I did renew his colchicine as well.

## 2021-11-19 ENCOUNTER — Ambulatory Visit: Payer: 59 | Admitting: Family Medicine

## 2021-11-30 ENCOUNTER — Other Ambulatory Visit: Payer: Self-pay | Admitting: Osteopathic Medicine

## 2021-11-30 DIAGNOSIS — Z951 Presence of aortocoronary bypass graft: Secondary | ICD-10-CM

## 2021-11-30 DIAGNOSIS — I10 Essential (primary) hypertension: Secondary | ICD-10-CM

## 2021-12-26 ENCOUNTER — Ambulatory Visit: Payer: Self-pay | Admitting: Family Medicine

## 2022-01-23 ENCOUNTER — Encounter: Payer: Self-pay | Admitting: Family Medicine

## 2022-01-23 ENCOUNTER — Other Ambulatory Visit: Payer: Self-pay

## 2022-01-23 ENCOUNTER — Telehealth: Payer: 59 | Admitting: Family Medicine

## 2022-01-23 VITALS — BP 140/100 | Resp 18 | Ht 74.0 in | Wt 297.0 lb

## 2022-01-23 DIAGNOSIS — J209 Acute bronchitis, unspecified: Secondary | ICD-10-CM

## 2022-01-23 MED ORDER — ALBUTEROL SULFATE HFA 108 (90 BASE) MCG/ACT IN AERS: 2.0000 | INHALATION_SPRAY | Freq: Four times a day (QID) | RESPIRATORY_TRACT | 0 refills | Status: DC | PRN

## 2022-01-23 MED ORDER — PREDNISONE 20 MG PO TABS: 40.0000 mg | ORAL_TABLET | Freq: Every day | ORAL | 0 refills | Status: DC

## 2022-01-23 NOTE — Progress Notes (Signed)
Virtual Visit via Video Note  I connected with Jon Mcbride on 01/23/22 at 11:30 AM EST by a video enabled telemedicine application and verified that I am speaking with the correct person using two identifiers.   I discussed the limitations of evaluation and management by telemedicine and the availability of in person appointments. The patient expressed understanding and agreed to proceed.  Patient location: at home Provider location: in office  Subjective:    CC:   Chief Complaint  Patient presents with   Cough    Productive cough, shortness of breath, 1 month. Covid test negative this morning.     HPI: Patient reports that about a month and a half ago he developed cough and some respiratory symptoms he says it lasted about 2 to 3 weeks and then he did get somewhat better for about a week and a half and then started to develop worsening cough again.  He says it is mostly dry with a little bit of sputum.  No GI symptoms.  No fevers or chills.  No ear pain.  He also reports some mild congestion but no facial pain or pressure.  He did get a new CPAP machine about 3 months ago and reports that he has not been most consistent with keeping it clean and wonders if that could even be contributing to his cough.  He is mostly just been relying on cough drops.  He started a low-carb diet about a week and a half ago and says he actually does feel better.  His goal is to lose about 60 pounds.  History of quadruple bypass.   Past medical history, Surgical history, Family history not pertinant except as noted below, Social history, Allergies, and medications have been entered into the medical record, reviewed, and corrections made.    Objective:    General: Speaking clearly in complete sentences without any shortness of breath.  Alert and oriented x3.  Normal judgment. No apparent acute distress.    Impression and Recommendations:    Problem List Items Addressed This Visit    None Visit Diagnoses     Acute bronchitis, unspecified organism    -  Primary      Acute bronchitis-discussed will treat with prednisone.  He also wanted to try an albuterol inhaler he has used 1 in the past and found it helpful.  If he is not feeling better after the weekend then please let us know if he develops new or worsening symptoms then please call us sooner.  No orders of the defined types were placed in this encounter.   Meds ordered this encounter  Medications   albuterol (VENTOLIN HFA) 108 (90 Base) MCG/ACT inhaler    Sig: Inhale 2 puffs into the lungs every 6 (six) hours as needed for wheezing or shortness of breath.    Dispense:  8 g    Refill:  0   predniSONE (DELTASONE) 20 MG tablet    Sig: Take 2 tablets (40 mg total) by mouth daily with breakfast.    Dispense:  10 tablet    Refill:  0     I discussed the assessment and treatment plan with the patient. The patient was provided an opportunity to ask questions and all were answered. The patient agreed with the plan and demonstrated an understanding of the instructions.   The patient was advised to call back or seek an in-person evaluation if the symptoms worsen or if the condition fails to improve as anticipated.  Nani Gasser, MD

## 2022-02-01 ENCOUNTER — Telehealth: Payer: Self-pay | Admitting: Osteopathic Medicine

## 2022-02-01 MED ORDER — ALLOPURINOL 300 MG PO TABS: 300.0000 mg | ORAL_TABLET | Freq: Every day | ORAL | 0 refills | Status: DC

## 2022-02-01 NOTE — Telephone Encounter (Signed)
Patient called in to get a refill on Allopurinol. Original a patient of Dr. Lyn Hollingshead. Transfer of care appt on 2/23 but will be out before then. Please advise.

## 2022-02-01 NOTE — Telephone Encounter (Signed)
Task completed by Marylynn Pearson. Pharmacy informed no further refills will be sent until patient is seen in the office by new provider.

## 2022-02-07 ENCOUNTER — Encounter: Payer: Self-pay | Admitting: Family Medicine

## 2022-02-07 ENCOUNTER — Ambulatory Visit: Payer: 59 | Admitting: Family Medicine

## 2022-02-07 ENCOUNTER — Other Ambulatory Visit: Payer: Self-pay

## 2022-02-07 VITALS — BP 103/66 | HR 70 | Temp 97.6°F | Ht 74.0 in | Wt 298.0 lb

## 2022-02-07 DIAGNOSIS — R7303 Prediabetes: Secondary | ICD-10-CM

## 2022-02-07 DIAGNOSIS — I252 Old myocardial infarction: Secondary | ICD-10-CM | POA: Diagnosis not present

## 2022-02-07 DIAGNOSIS — I10 Essential (primary) hypertension: Secondary | ICD-10-CM

## 2022-02-07 DIAGNOSIS — Z951 Presence of aortocoronary bypass graft: Secondary | ICD-10-CM

## 2022-02-07 MED ORDER — METOPROLOL SUCCINATE ER 25 MG PO TB24: 25.0000 mg | ORAL_TABLET | Freq: Every day | ORAL | 3 refills | Status: DC

## 2022-02-07 MED ORDER — ASPIRIN 81 MG PO TBEC: 81.0000 mg | DELAYED_RELEASE_TABLET | Freq: Every day | ORAL | 3 refills | Status: DC

## 2022-02-07 MED ORDER — OZEMPIC (0.25 OR 0.5 MG/DOSE) 2 MG/1.5ML ~~LOC~~ SOPN: PEN_INJECTOR | SUBCUTANEOUS | 1 refills | Status: DC

## 2022-02-07 MED ORDER — ALLOPURINOL 300 MG PO TABS: 300.0000 mg | ORAL_TABLET | Freq: Every day | ORAL | 3 refills | Status: DC

## 2022-02-09 ENCOUNTER — Other Ambulatory Visit: Payer: Self-pay | Admitting: Family Medicine

## 2022-02-09 ENCOUNTER — Other Ambulatory Visit: Payer: Self-pay | Admitting: Osteopathic Medicine

## 2022-02-10 NOTE — Assessment & Plan Note (Signed)
Discussed options for weight management.  I think he would benefit from adding GLP-1 medication as he has history of prediabetes as well as cardiovascular disease

## 2022-02-10 NOTE — Progress Notes (Signed)
Jon Mcbride - 61 y.o. male MRN [id number]  Date of birth: Jul 19, 1961  Subjective Chief Complaint  Patient presents with   New Patient (Initial Visit)    HPI Jon Mcbride is a 61 year old male here today for follow-up visit.  He is a former patient of Dr. Lyn Hollingshead.  He has history of hypertension, coronary artery disease status post MI with CABG, prediabetes.  He does continue to see cardiology regularly.  Blood pressure has been well controlled with irbesartan and metoprolol.  He denies side effects related to these medications.  He is taking Praluent due to statin intolerance.  He has been trying to work on weight loss through exercise and dietary change but has not seen significant results.  He is interested in trying medications to help with weight loss.  ROS:  A comprehensive ROS was completed and negative except as noted per HPI  No Known Allergies  Past Medical History:  Diagnosis Date   Gout    Heart attack (HCC)    Hypertension     Past Surgical History:  Procedure Laterality Date   CAROTID STENT INSERTION      Social History   Socioeconomic History   Marital status: Divorced    Spouse name: Not on file   Number of children: Not on file   Years of education: Not on file   Highest education level: Not on file  Occupational History   Not on file  Tobacco Use   Smoking status: Never   Smokeless tobacco: Never  Substance and Sexual Activity   Alcohol use: No   Drug use: No   Sexual activity: Not on file  Other Topics Concern   Not on file  Social History Narrative   Not on file   Social Determinants of Health   Financial Resource Strain: Not on file  Food Insecurity: Not on file  Transportation Needs: Not on file  Physical Activity: Not on file  Stress: Not on file  Social Connections: Not on file    Family History  Problem Relation Age of Onset   Hypertension Father    Hyperlipidemia Father    Cancer Mother    Hyperlipidemia Mother     Health  Maintenance  Topic Date Due   COVID-19 Vaccine (1) Never done   COLONOSCOPY (Pts 45-80yrs Insurance coverage will need to be confirmed)  Never done   Zoster Vaccines- Shingrix (2 of 2) 08/27/2021   INFLUENZA VACCINE  03/15/2022 (Originally 07/16/2021)   TETANUS/TDAP  01/01/2023   Hepatitis C Screening  Completed   HIV Screening  Completed   HPV VACCINES  Aged Out     ----------------------------------------------------------------------------------------------------------------------------------------------------------------------------------------------------------------- Physical Exam BP 103/66    Pulse 70    Temp 97.6 F (36.4 C)    Ht 6\' 2"  (1.88 m)    Wt 298 lb (135.2 kg)    SpO2 94%    BMI 38.26 kg/m   Physical Exam Constitutional:      Appearance: Normal appearance.  Eyes:     General: No scleral icterus. Cardiovascular:     Rate and Rhythm: Normal rate and regular rhythm.  Pulmonary:     Effort: Pulmonary effort is normal.     Breath sounds: Normal breath sounds.  Musculoskeletal:     Cervical back: Neck supple.  Neurological:     Mental Status: He is alert.  Psychiatric:        Mood and Affect: Mood normal.        Behavior: Behavior normal.    -------------------------------------------------------------------------------------------------------------------------------------------------------------------------------------------------------------------  Assessment and Plan  HTN (hypertension), benign Blood pressure remains well controlled with current medications.  No anginal symptoms at this time.  Recommend continuation of current medications.  History of quadruple bypass He sees cardiology regularly.  He will continue management of risk factors as well as beta-blocker and aspirin.  Morbid obesity (HCC) Discussed options for weight management.  I think he would benefit from adding GLP-1 medication as he has history of prediabetes as well as cardiovascular  disease   Meds ordered this encounter  Medications   allopurinol (ZYLOPRIM) 300 MG tablet    Sig: Take 1 tablet (300 mg total) by mouth daily.    Dispense:  90 tablet    Refill:  3   metoprolol succinate (TOPROL-XL) 25 MG 24 hr tablet    Sig: Take 1 tablet (25 mg total) by mouth daily.    Dispense:  90 tablet    Refill:  3   aspirin 81 MG EC tablet    Sig: Take 1 tablet (81 mg total) by mouth daily.    Dispense:  90 tablet    Refill:  3   Semaglutide,0.25 or 0.5MG /DOS, (OZEMPIC, 0.25 OR 0.5 MG/DOSE,) 2 MG/1.5ML SOPN    Sig: Inject 0.25mg  weekly x4 weeks then increase to 0.5mg  weekly    Dispense:  3 mL    Refill:  1    Return in about 3 months (around 05/07/2022) for Labs/Weight/HTN.    This visit occurred during the SARS-CoV-2 public health emergency.  Safety protocols were in place, including screening questions prior to the visit, additional usage of staff PPE, and extensive cleaning of exam room while observing appropriate contact time as indicated for disinfecting solutions.

## 2022-02-10 NOTE — Assessment & Plan Note (Signed)
He sees cardiology regularly.  He will continue management of risk factors as well as beta-blocker and aspirin.

## 2022-02-10 NOTE — Assessment & Plan Note (Signed)
Blood pressure remains well controlled with current medications.  No anginal symptoms at this time.  Recommend continuation of current medications.

## 2022-02-19 ENCOUNTER — Telehealth: Payer: Self-pay

## 2022-02-19 NOTE — Telephone Encounter (Addendum)
Initiated Prior authorization [id number] (0.25 or 0.5 MG/DOSE) 2MG /1.5ML pen- ?Via: Covermymeds ?Case/Key:: [id number] ?Status: approved as of 02/19/22 ?Reason:this request is approved from 02/19/2022 to 02/18/2025. ?Notified Pt via: Mychart ?

## 2022-02-27 ENCOUNTER — Telehealth: Payer: Self-pay

## 2022-02-27 NOTE — Telephone Encounter (Signed)
Pt lvm stating dizziness. He started taking Ozempic. BP was within range. Thinks it's a side-effect. He states the effect has tapered off.  ? ?He will continue medication and callback if dizziness restarts. ?

## 2022-04-09 ENCOUNTER — Telehealth: Payer: Self-pay

## 2022-04-09 NOTE — Telephone Encounter (Signed)
Called patient to schedule appointment and he wanted to discuss his symptoms instead of scheduling an appointment, transferred to the nurse's line so he can explain everything that is going on.  ?

## 2022-04-09 NOTE — Telephone Encounter (Signed)
Patient scheduled.

## 2022-04-09 NOTE — Telephone Encounter (Signed)
Pt lvm stating concern about dizziness.  ? ?Pls contact the pt to schedule appt for dizziness. Thanks ?

## 2022-04-16 ENCOUNTER — Telehealth: Payer: Self-pay | Admitting: General Practice

## 2022-04-16 ENCOUNTER — Encounter: Payer: Self-pay | Admitting: Physician Assistant

## 2022-04-16 ENCOUNTER — Ambulatory Visit: Payer: 59 | Admitting: Physician Assistant

## 2022-04-16 VITALS — Ht 74.0 in | Wt 287.0 lb

## 2022-04-16 DIAGNOSIS — R42 Dizziness and giddiness: Secondary | ICD-10-CM | POA: Diagnosis not present

## 2022-04-16 DIAGNOSIS — R5383 Other fatigue: Secondary | ICD-10-CM

## 2022-04-16 DIAGNOSIS — J309 Allergic rhinitis, unspecified: Secondary | ICD-10-CM | POA: Insufficient documentation

## 2022-04-16 DIAGNOSIS — J3489 Other specified disorders of nose and nasal sinuses: Secondary | ICD-10-CM

## 2022-04-16 HISTORY — DX: Allergic rhinitis, unspecified: J30.9

## 2022-04-16 MED ORDER — ALBUTEROL SULFATE HFA 108 (90 BASE) MCG/ACT IN AERS: INHALATION_SPRAY | RESPIRATORY_TRACT | 0 refills | Status: DC

## 2022-04-16 MED ORDER — AMOXICILLIN-POT CLAVULANATE 875-125 MG PO TABS: 1.0000 | ORAL_TABLET | Freq: Two times a day (BID) | ORAL | 0 refills | Status: AC

## 2022-04-16 MED ORDER — MECLIZINE HCL 25 MG PO TABS: 25.0000 mg | ORAL_TABLET | Freq: Three times a day (TID) | ORAL | 0 refills | Status: DC | PRN

## 2022-04-16 MED ORDER — METHYLPREDNISOLONE 4 MG PO TBPK: ORAL_TABLET | ORAL | 0 refills | Status: DC

## 2022-04-16 NOTE — Progress Notes (Signed)
? ?Acute Office Visit ? ?Subjective:  ? ?  ?Patient ID: Jon Mcbride, male    DOB: [birth year], 61 y.o.   MRN: [id number] ? ?Chief Complaint  ?Patient presents with  ? Dizziness  ? Fatigue  ? ? ?HPI ?Patient is in today for no energy, sinus pressure, dizziness for over a month. He originally stopped ozempic because he felt like it was causing his dizziness. He was doing great with weight loss and he would like to retry if not causing his dizziness. He has sinus pressure and intermittent dizziness with positions. No CP, palpitations. No swelling. No fever, chills, body aches. He has some intermittent congestion, runny nose, sneezing.  ? ? ?.. ?Active Ambulatory Problems  ?  Diagnosis Date Noted  ? HTN (hypertension), benign 01/01/2013  ? Stented coronary artery 01/01/2013  ? History of quadruple bypass 06/03/2017  ? OSA (obstructive sleep apnea) 02/26/2018  ? Morbid obesity (HCC) 10/06/2018  ? Bilateral primary osteoarthritis of knee 10/07/2018  ? Psoriasis 11/06/2018  ? Acute gout of left wrist 04/12/2019  ? H/O myocardial infarction, greater than 8 weeks 01/01/2013  ? Allergic sinusitis 04/16/2022  ? ?Resolved Ambulatory Problems  ?  Diagnosis Date Noted  ? Acute pain of right knee 10/06/2018  ? Cutaneous skin tags 01/23/2013  ? ?Past Medical History:  ?Diagnosis Date  ? Gout   ? Heart attack (HCC)   ? Hypertension   ? ? ?ROS ?See HPI.  ? ?   ?Objective:  ?  ?Ht 6\' 2"  (1.88 m)   Wt 287 lb (130.2 kg)   BMI 36.85 kg/m?  ?BP Readings from Last 3 Encounters:  ?02/07/22 103/66  ?01/23/22 (!) 140/100  ?04/24/21 (!) 146/76  ? ?Wt Readings from Last 3 Encounters:  ?04/16/22 287 lb (130.2 kg)  ?02/07/22 298 lb (135.2 kg)  ?01/23/22 297 lb (134.7 kg)  ? ? 04/08/23Marland KitchenNo data found. ? ? ? ?Physical Exam ?Vitals reviewed.  ?Constitutional:   ?   Appearance: Normal appearance. He is obese.  ?HENT:  ?   Head: Normocephalic.  ?   Right Ear: Tympanic membrane, ear canal and external ear normal. There is no impacted cerumen.  ?   Left Ear:  Tympanic membrane, ear canal and external ear normal. There is no impacted cerumen.  ?   Nose: Nose normal.  ?   Mouth/Throat:  ?   Mouth: Mucous membranes are moist.  ?   Pharynx: No posterior oropharyngeal erythema.  ?Eyes:  ?   Conjunctiva/sclera: Conjunctivae normal.  ?Cardiovascular:  ?   Rate and Rhythm: Normal rate and regular rhythm.  ?   Pulses: Normal pulses.  ?   Heart sounds: Normal heart sounds. No murmur heard. ?Pulmonary:  ?   Effort: Pulmonary effort is normal.  ?   Breath sounds: Normal breath sounds.  ?Musculoskeletal:  ?   Right lower leg: No edema.  ?   Left lower leg: No edema.  ?Neurological:  ?   General: No focal deficit present.  ?   Mental Status: He is alert and oriented to person, place, and time.  ?   Comments: Dizziness with dix hallpike laying down but no nystagmus. Greater to the right.   ?Psychiatric:     ?   Mood and Affect: Mood normal.  ? ? ? ? ? ?   ?Assessment & Plan:  ?..[place] was seen today for dizziness and fatigue. ? ?Diagnoses and all orders for this visit: ? ?Allergic sinusitis ?-     amoxicillin-clavulanate (  AUGMENTIN) 875-125 MG tablet; Take 1 tablet by mouth 2 (two) times daily for 10 days. ?-     methylPREDNISolone (MEDROL DOSEPAK) 4 MG TBPK tablet; Take as directed by package insert. ? ?Vertigo ?-     TSH ?-     B12 and Folate Panel ?-     Testosterone Total,Free,Bio, Males ?-     VITAMIN D 25 Hydroxy (Vit-D Deficiency, Fractures) ?-     meclizine (ANTIVERT) 25 MG tablet; Take 1 tablet (25 mg total) by mouth 3 (three) times daily as needed for dizziness. ? ?No energy ?-     TSH ?-     B12 and Folate Panel ?-     Testosterone Total,Free,Bio, Males ?-     VITAMIN D 25 Hydroxy (Vit-D Deficiency, Fractures) ? ?Sinus pressure ?-     amoxicillin-clavulanate (AUGMENTIN) 875-125 MG tablet; Take 1 tablet by mouth 2 (two) times daily for 10 days. ?-     methylPREDNISolone (MEDROL DOSEPAK) 4 MG TBPK tablet; Take as directed by package insert. ? ?Other orders ?-     albuterol  (VENTOLIN HFA) 108 (90 Base) MCG/ACT inhaler; TAKE 2 PUFFS BY MOUTH EVERY 6 HOURS AS NEEDED FOR WHEEZE OR SHORTNESS OF BREATH ? ? ?BP looks great today. Orthostatic looks great.  ?Dizziness and sinus pressure will treat for sinusitis with augmentin and medrol dose pack ?Will get labs for energy ?Discussed vertigo and epley manuevers ?Consider antivert as needed for dizziness ?Consider zyrtec daily for ongoing allergy symptoms through allergy season ?Follow up as needed or if symptoms worsen.  ? ?Return if symptoms worsen or fail to improve. ? ?Tandy Gaw, PA-C ? ? ?

## 2022-04-16 NOTE — Telephone Encounter (Signed)
Transition Care Management Follow-up Telephone Call ?Date of discharge and from where: 04/11/22 from [facility] ?How have you been since you were released from the hospital? Patient has an appt with Tandy Gaw PA today. ?Any questions or concerns? No ? ? ?

## 2022-04-16 NOTE — Patient Instructions (Addendum)
Start augmentin and medrol dose for sinusitis ? ?Zyrtec or Claritin Daily ?Antivert as needed for dizziness ? ?Vertigo ?Vertigo is the feeling that you or the things around you are moving when they are not. This feeling can come and go at any time. Vertigo often goes away on its own. This condition can be dangerous if it happens when you are doing activities like driving or working with machines. ?Your doctor will do tests to find the cause of your vertigo. These tests will also help your doctor decide on the best treatment for you. ?Follow these instructions at home: ?Eating and drinking ? ?  ? ?Drink enough fluid to keep your pee (urine) pale yellow. ?Do not drink alcohol. ?Activity ?Return to your normal activities when your doctor says that it is safe. ?In the morning, first sit up on the side of the bed. When you feel okay, stand slowly while you hold onto something until you know that your balance is fine. ?Move slowly. Avoid sudden body or head movements or certain positions, as told by your doctor. ?Use a cane if you have trouble standing or walking. ?Sit down right away if you feel dizzy. ?Avoid doing any tasks or activities that can cause danger to you or others if you get dizzy. ?Avoid bending down if you feel dizzy. Place items in your home so that they are easy for you to reach without bending or leaning over. ?Do not drive or use machinery if you feel dizzy. ?General instructions ?Take over-the-counter and prescription medicines only as told by your doctor. ?Keep all follow-up visits. ?Contact a doctor if: ?Your medicine does not help your vertigo. ?Your problems get worse or you have new symptoms. ?You have a fever. ?You feel like you may vomit (nauseous), or this feeling gets worse. ?You start to vomit. ?Your family or friends see changes in how you act. ?You lose feeling (have numbness) in part of your body. ?You feel prickling and tingling in a part of your body. ?Get help right away if: ?You are  always dizzy. ?You faint. ?You get very bad headaches. ?You get a stiff neck. ?Bright light starts to bother you. ?You have trouble moving or talking. ?You feel weak in your hands, arms, or legs. ?You have changes in your hearing or in how you see (vision). ?These symptoms may be an emergency. Get help right away. Call your local emergency services (911 in the [place] ?Do not wait to see if the symptoms will go away. ?Do not drive yourself to the hospital. ?Summary ?Vertigo is the feeling that you or the things around you are moving when they are not. ?Your doctor will do tests to find the cause of your vertigo. ?You may be told to avoid some tasks, positions, or movements. ?Contact a doctor if your medicine is not helping, or if you have a fever, new symptoms, or a change in how you act. ?Get help right away if you get very bad headaches, or if you have changes in how you speak, hear, or see. ?This information is not intended to replace advice given to you by your health care provider. Make sure you discuss any questions you have with your health care provider. ?Document Revised: 11/01/2020 Document Reviewed: 11/01/2020 ?Elsevier Patient Education ? [place]. ? ?

## 2022-04-17 LAB — B12 AND FOLATE PANEL
Folate: 13.4 ng/mL
Vitamin B-12: 408 pg/mL (ref 200–1100)

## 2022-04-17 LAB — TESTOSTERONE TOTAL,FREE,BIO, MALES
Albumin: 4 g/dL (ref 3.6–5.1)
Sex Hormone Binding: 72 nmol/L (ref 22–77)
Testosterone, Bioavailable: 54.6 ng/dL — ABNORMAL LOW (ref 110.0–575.0)
Testosterone, Free: 29.7 pg/mL — ABNORMAL LOW (ref 46.0–224.0)
Testosterone: 442 ng/dL (ref 250–827)

## 2022-04-17 LAB — TSH: TSH: 3.91 mIU/L (ref 0.40–4.50)

## 2022-04-17 LAB — VITAMIN D 25 HYDROXY (VIT D DEFICIENCY, FRACTURES): Vit D, 25-Hydroxy: 37 ng/mL (ref 30–100)

## 2022-04-17 NOTE — Progress Notes (Signed)
Vitamin D better than 1 year ago but still low normal. How much are you taking now?  ? ?B12 looks ok. Could be a little higher. Are you taking any b12 right now?  ? ?Thyroid stable.  ? ?Testosterone pretty normal range. Free a little low. Could consider some replacement but likely not causing all your symptoms. Discuss with Dr. Ashley Royalty when you follow up.  ? ?

## 2022-05-07 ENCOUNTER — Ambulatory Visit: Payer: 59 | Admitting: Family Medicine

## 2022-05-07 ENCOUNTER — Encounter: Payer: Self-pay | Admitting: Family Medicine

## 2022-05-07 DIAGNOSIS — L409 Psoriasis, unspecified: Secondary | ICD-10-CM | POA: Diagnosis not present

## 2022-05-07 DIAGNOSIS — I10 Essential (primary) hypertension: Secondary | ICD-10-CM | POA: Diagnosis not present

## 2022-05-07 MED ORDER — CLOBETASOL PROPIONATE 0.05 % EX FOAM: Freq: Two times a day (BID) | CUTANEOUS | 0 refills | Status: DC

## 2022-05-07 MED ORDER — CALCIPOTRIENE 0.005 % EX OINT
TOPICAL_OINTMENT | Freq: Two times a day (BID) | CUTANEOUS | 0 refills | Status: DC
Start: 2022-05-07 — End: 2023-02-13

## 2022-05-07 NOTE — Assessment & Plan Note (Signed)
Blood pressure mains well controlled at this time.  Recommend continuation of current medication for management of hypertension.

## 2022-05-07 NOTE — Patient Instructions (Signed)
Try combination of clobetasol foam and dovonex ointment for psoriasis.  See me again in 3 months.

## 2022-05-07 NOTE — Progress Notes (Signed)
Jon Mcbride - 61 y.o. male MRN [id number]  Date of birth: 1961/10/12  Subjective Chief Complaint  Patient presents with   Hypertension   Psoriasis    HPI Jon Mcbride is a 61 year old male here today for follow-up visit.  Reports overall he is doing well.  He did discontinue Ozempic for a short period of time as he thought this may have been making him dizzy.  Symptoms seem to be nonvertiginous in nature related to allergies and eustachian tube dysfunction.  This has improved With better management of allergies.  He is planning on restarting Ozempic.  He continues to have problems with psoriasis on bilateral legs.  He has tried betamethasone without much improvement.  He has never tried vitamin D analogs.  ROS:  A comprehensive ROS was completed and negative except as noted per HPI  No Known Allergies  Past Medical History:  Diagnosis Date   Gout    Heart attack (HCC)    Hypertension     Past Surgical History:  Procedure Laterality Date   CAROTID STENT INSERTION      Social History   Socioeconomic History   Marital status: Divorced    Spouse name: Not on file   Number of children: Not on file   Years of education: Not on file   Highest education level: Not on file  Occupational History   Not on file  Tobacco Use   Smoking status: Never   Smokeless tobacco: Never  Substance and Sexual Activity   Alcohol use: No   Drug use: No   Sexual activity: Not on file  Other Topics Concern   Not on file  Social History Narrative   Not on file   Social Determinants of Health   Financial Resource Strain: Not on file  Food Insecurity: Not on file  Transportation Needs: Not on file  Physical Activity: Not on file  Stress: Not on file  Social Connections: Not on file    Family History  Problem Relation Age of Onset   Hypertension Father    Hyperlipidemia Father    Cancer Mother    Hyperlipidemia Mother     Health Maintenance  Topic Date Due   Zoster Vaccines-  Shingrix (2 of 2) 07/17/2022 (Originally 08/27/2021)   COVID-19 Vaccine (1) 08/16/2022 (Originally 03/23/1962)   COLONOSCOPY (Pts 45-9yrs Insurance coverage will need to be confirmed)  04/17/2023 (Originally 09/22/2005)   INFLUENZA VACCINE  07/16/2022   TETANUS/TDAP  01/01/2023   Hepatitis C Screening  Completed   HIV Screening  Completed   HPV VACCINES  Aged Out     ----------------------------------------------------------------------------------------------------------------------------------------------------------------------------------------------------------------- Physical Exam BP 105/68 (BP Location: Left Arm, Patient Position: Sitting, Cuff Size: Large)   Pulse 65   Ht 6\' 2"  (1.88 m)   Wt 291 lb (132 kg)   SpO2 96%   BMI 37.36 kg/m   Physical Exam Constitutional:      Appearance: Normal appearance.  Eyes:     General: No scleral icterus. Cardiovascular:     Rate and Rhythm: Normal rate and regular rhythm.  Musculoskeletal:     Cervical back: Neck supple.  Neurological:     Mental Status: He is alert.  Psychiatric:        Mood and Affect: Mood normal.        Behavior: Behavior normal.    ------------------------------------------------------------------------------------------------------------------------------------------------------------------------------------------------------------------- Assessment and Plan  HTN (hypertension), benign Blood pressure mains well controlled at this time.  Recommend continuation of current medication for management of hypertension.  Psoriasis We  will try combination of clobetasol foam with calcipotriene ointment.  May need referral to dermatology if not improving with this.  Morbid obesity (HCC) Plans on restarting Ozempic due to history of cardiovascular disease and obesity.   Meds ordered this encounter  Medications   clobetasol (OLUX) 0.05 % topical foam    Sig: Apply topically 2 (two) times daily.    Dispense:  50  g    Refill:  0   calcipotriene (DOVONOX) 0.005 % ointment    Sig: Apply topically 2 (two) times daily.    Dispense:  60 g    Refill:  0    Return in about 3 months (around 08/07/2022) for HTN/psoriasis..    This visit occurred during the SARS-CoV-2 public health emergency.  Safety protocols were in place, including screening questions prior to the visit, additional usage of staff PPE, and extensive cleaning of exam room while observing appropriate contact time as indicated for disinfecting solutions.

## 2022-05-07 NOTE — Assessment & Plan Note (Signed)
Plans on restarting Ozempic due to history of cardiovascular disease and obesity.

## 2022-05-07 NOTE — Assessment & Plan Note (Signed)
We will try combination of clobetasol foam with calcipotriene ointment.  May need referral to dermatology if not improving with this.

## 2022-10-02 ENCOUNTER — Telehealth: Payer: Self-pay

## 2022-10-02 NOTE — Telephone Encounter (Signed)
Patient called and left vm - requesting a return call. I called patient and he questioned when his last stress test was done and if he needed to schedule one soon. States has history of past MI 4 to 5 years ago  and open heart surgery 5 years ago.  He had some dizziness this morning that concerned him. In checking his chart he was due for a f/u in Aug . Patient connected to front desk and schld a f/u on oct 31st and will discuss stress test at this visit as well.

## 2022-10-15 ENCOUNTER — Ambulatory Visit: Payer: 59 | Admitting: Family Medicine

## 2022-10-15 ENCOUNTER — Encounter: Payer: Self-pay | Admitting: Family Medicine

## 2022-10-15 DIAGNOSIS — L409 Psoriasis, unspecified: Secondary | ICD-10-CM | POA: Diagnosis not present

## 2022-10-15 DIAGNOSIS — I2510 Atherosclerotic heart disease of native coronary artery without angina pectoris: Secondary | ICD-10-CM | POA: Insufficient documentation

## 2022-10-15 DIAGNOSIS — I25810 Atherosclerosis of coronary artery bypass graft(s) without angina pectoris: Secondary | ICD-10-CM | POA: Diagnosis not present

## 2022-10-15 DIAGNOSIS — N529 Male erectile dysfunction, unspecified: Secondary | ICD-10-CM

## 2022-10-15 DIAGNOSIS — I10 Essential (primary) hypertension: Secondary | ICD-10-CM | POA: Diagnosis not present

## 2022-10-15 DIAGNOSIS — R7303 Prediabetes: Secondary | ICD-10-CM

## 2022-10-15 HISTORY — DX: Male erectile dysfunction, unspecified: N52.9

## 2022-10-15 HISTORY — DX: Prediabetes: R73.03

## 2022-10-15 HISTORY — DX: Atherosclerotic heart disease of native coronary artery without angina pectoris: I25.10

## 2022-10-15 MED ORDER — OZEMPIC (0.25 OR 0.5 MG/DOSE) 2 MG/3ML ~~LOC~~ SOPN: PEN_INJECTOR | SUBCUTANEOUS | 2 refills | Status: DC

## 2022-10-15 MED ORDER — SILDENAFIL CITRATE 100 MG PO TABS: 50.0000 mg | ORAL_TABLET | Freq: Every day | ORAL | 6 refills | Status: DC | PRN

## 2022-10-15 NOTE — Assessment & Plan Note (Signed)
Denies anginal symptoms.  He does continue to see cardiology regularly.  Recommend continuation of current medications for management of hypertension, hyperlipidemia.  Continue aspirin for antiplatelet therapy.

## 2022-10-15 NOTE — Assessment & Plan Note (Signed)
Blood pressure is well controlled at this time with current medications.

## 2022-10-15 NOTE — Assessment & Plan Note (Signed)
Who is doing well with Ozempic.  We will restart this.

## 2022-10-15 NOTE — Assessment & Plan Note (Signed)
Looking at other options for his psoriasis however we may need to have him see dermatology to discuss Biologics.

## 2022-10-15 NOTE — Progress Notes (Signed)
Jon Mcbride - 61 y.o. male MRN [id number]  Date of birth: 1961/06/21  Subjective Chief Complaint  Patient presents with   Hypertension   Psoriasis   Dizziness    HPI Jon Mcbride is a 61 year old male here today for follow-up visit.  Continues on irbesartan and metoprolol for management of hypertension.  Blood pressure has been well controlled with this.  He has not noted any significant side effects with current medication  He does have history of coronary artery disease status post four-vessel CABG.  He does continue to see cardiology on a regular basis.  Remains on Praluent for management of hyperlipidemia.  He has not had any recent episodes of chest pain.  Denies exertional dyspnea, dizziness or edema.  He has been on Ozempic for management of of elevated blood sugar as well as for additional secondary cardiovascular risk reduction.  He would like to restart this.  He did tolerate this well previously.    Continues to have issues with psoriasis on his forearms.  Typically improves during the summer and worsens during the winter.  He is trying clobetasol and calcipotriene cream however this does not seem to be providing a whole lot of improvement.  He would like to add sildenafil back on.  He is not on any nitrate containing medications.    ROS:  A comprehensive ROS was completed and negative except as noted per HPI .     No Known Allergies  Past Medical History:  Diagnosis Date   Gout    Heart attack ([place])    Hypertension     Past Surgical History:  Procedure Laterality Date   CAROTID STENT INSERTION     CORONARY ARTERY BYPASS GRAFT      Social History   Socioeconomic History   Marital status: Divorced    Spouse name: Not on file   Number of children: Not on file   Years of education: Not on file   Highest education level: Not on file  Occupational History   Not on file  Tobacco Use   Smoking status: Never   Smokeless tobacco: Never  Substance and  Sexual Activity   Alcohol use: No   Drug use: No   Sexual activity: Not on file  Other Topics Concern   Not on file  Social History Narrative   Not on file   Social Determinants of Health   Financial Resource Strain: Not on file  Food Insecurity: Not on file  Transportation Needs: Not on file  Physical Activity: Not on file  Stress: Not on file  Social Connections: Not on file    Family History  Problem Relation Age of Onset   Hypertension Father    Hyperlipidemia Father    Cancer Mother    Hyperlipidemia Mother     Health Maintenance  Topic Date Due   Zoster Vaccines- Shingrix (2 of 2) 01/15/2023 (Originally 08/27/2021)   COVID-19 Vaccine (1) 01/16/2023 (Originally 03/23/1962)   INFLUENZA VACCINE  03/16/2023 (Originally 07/16/2022)   COLONOSCOPY (Pts 45-64yrs Insurance coverage will need to be confirmed)  04/17/2023 (Originally 09/22/2005)   TETANUS/TDAP  01/01/2023   Hepatitis C Screening  Completed   HIV Screening  Completed   HPV VACCINES  Aged Out     ----------------------------------------------------------------------------------------------------------------------------------------------------------------------------------------------------------------- Physical Exam BP 116/73 (BP Location: Left Arm, Patient Position: Sitting, Cuff Size: Large)   Pulse (!) 59   Ht 6\' 2"  (1.88 m)   Wt 298 lb (135.2 kg)   SpO2 96%   BMI 38.26  kg/m   Physical Exam Constitutional:      Appearance: Normal appearance.  Eyes:     General: No scleral icterus. Cardiovascular:     Rate and Rhythm: Normal rate and regular rhythm.  Pulmonary:     Effort: Pulmonary effort is normal.     Breath sounds: Normal breath sounds.  Musculoskeletal:     Cervical back: Neck supple.  Skin:    Comments: Scaly rash with white plaques on bilateral forearms.  Neurological:     Mental Status: He is alert.  Psychiatric:        Mood and Affect: Mood normal.        Behavior: Behavior normal.      ------------------------------------------------------------------------------------------------------------------------------------------------------------------------------------------------------------------- Assessment and Plan  Psoriasis Looking at other options for his psoriasis however we may need to have him see dermatology to discuss Biologics.  HTN (hypertension), benign Blood pressure is well controlled at this time with current medications.  Coronary artery disease Denies anginal symptoms.  He does continue to see cardiology regularly.  Recommend continuation of current medications for management of hypertension, hyperlipidemia.  Continue aspirin for antiplatelet therapy.  Erectile dysfunction No anginal symptoms and is not on any nitrate continue medications.  Adding sildenafil as needed.  Prediabetes Who is doing well with Ozempic.  We will restart this.   Meds ordered this encounter  Medications   Semaglutide,0.25 or 0.5MG /DOS, (OZEMPIC, 0.25 OR 0.5 MG/DOSE,) 2 MG/3ML SOPN    Sig: Inject 0.25mg  weekly x4 weeks then increase to 0.5mg  weeky    Dispense:  3 mL    Refill:  2   sildenafil (VIAGRA) 100 MG tablet    Sig: Take 0.5-1 tablets (50-100 mg total) by mouth daily as needed for erectile dysfunction.    Dispense:  10 tablet    Refill:  6    Return in about 4 months (around 02/13/2023) for HTN/Weight loss.    This visit occurred during the SARS-CoV-2 public health emergency.  Safety protocols were in place, including screening questions prior to the visit, additional usage of staff PPE, and extensive cleaning of exam room while observing appropriate contact time as indicated for disinfecting solutions.

## 2022-10-15 NOTE — Assessment & Plan Note (Signed)
No anginal symptoms and is not on any nitrate continue medications.  Adding sildenafil as needed.

## 2023-02-13 ENCOUNTER — Ambulatory Visit: Payer: 59 | Admitting: Family Medicine

## 2023-02-13 ENCOUNTER — Encounter: Payer: Self-pay | Admitting: Family Medicine

## 2023-02-13 VITALS — BP 112/72 | HR 56 | Ht 74.0 in | Wt 300.0 lb

## 2023-02-13 DIAGNOSIS — L409 Psoriasis, unspecified: Secondary | ICD-10-CM | POA: Diagnosis not present

## 2023-02-13 DIAGNOSIS — M170 Bilateral primary osteoarthritis of knee: Secondary | ICD-10-CM

## 2023-02-13 DIAGNOSIS — I25810 Atherosclerosis of coronary artery bypass graft(s) without angina pectoris: Secondary | ICD-10-CM | POA: Diagnosis not present

## 2023-02-13 DIAGNOSIS — R7303 Prediabetes: Secondary | ICD-10-CM

## 2023-02-13 DIAGNOSIS — I10 Essential (primary) hypertension: Secondary | ICD-10-CM | POA: Diagnosis not present

## 2023-02-13 MED ORDER — SILDENAFIL CITRATE 20 MG PO TABS: ORAL_TABLET | ORAL | 1 refills | Status: DC

## 2023-02-13 MED ORDER — ALBUTEROL SULFATE HFA 108 (90 BASE) MCG/ACT IN AERS: INHALATION_SPRAY | RESPIRATORY_TRACT | 0 refills | Status: DC

## 2023-02-13 MED ORDER — CLOBETASOL PROPIONATE 0.05 % EX FOAM
Freq: Two times a day (BID) | CUTANEOUS | 3 refills | Status: DC
Start: 2023-02-13 — End: 2023-08-19

## 2023-02-13 NOTE — Assessment & Plan Note (Addendum)
Seeing cardiology previously but has not been seen in quite some time.  Referral placed to new cardiologist.  Has not taken Praluent in some time.  Reports she cannot tolerate statins.  We may need to look into patient assistance for him to have [place] or Praluent.

## 2023-02-13 NOTE — Assessment & Plan Note (Signed)
Mounjaro proved to be too expensive.  He will continue to work on dietary change.

## 2023-02-13 NOTE — Progress Notes (Signed)
Jon Mcbride - 62 y.o. male MRN [id number]  Date of birth: 02-Mar-1961  Subjective Chief Complaint  Patient presents with   Follow-up   Cough    HPI Jon Mcbride is a 62 year old male here today for follow-up visit.  History of MI and coronary artery disease status post CABG x 4.  He has not seen cardiology recently.  No new cardiologist at this time.  Remains on irbesartan.  He was prescribed Praluent but has not been taking, he thinks this may be due to cost.  He was intolerant to statins.  He is taking aspirin.  Denies any chest pain, shortness of breath, palpitations, headaches or vision changes.  Blood pressure remains well-controlled with irbesartan and Toprol-XL.  He has not been using topicals for management of his psoriasis.  This has flared back up some.  Reports that calcipotriene ointment was too expensive.  100 mg tabs of sildenafil were too expensive as well.    ROS:  A comprehensive ROS was completed and negative except as noted per HPI    No Known Allergies  Past Medical History:  Diagnosis Date   Gout    Heart attack ([place])    Hypertension     Past Surgical History:  Procedure Laterality Date   CAROTID STENT INSERTION     CORONARY ARTERY BYPASS GRAFT      Social History   Socioeconomic History   Marital status: Divorced    Spouse name: Not on file   Number of children: Not on file   Years of education: Not on file   Highest education level: Not on file  Occupational History   Not on file  Tobacco Use   Smoking status: Never   Smokeless tobacco: Never  Substance and Sexual Activity   Alcohol use: No   Drug use: No   Sexual activity: Not on file  Other Topics Concern   Not on file  Social History Narrative   Not on file   Social Determinants of Health   Financial Resource Strain: Not on file  Food Insecurity: Not on file  Transportation Needs: Not on file  Physical Activity: Not on file  Stress: Not on file  Social Connections: Not on file     Family History  Problem Relation Age of Onset   Hypertension Father    Hyperlipidemia Father    Cancer Mother    Hyperlipidemia Mother     Health Maintenance  Topic Date Due   DTaP/Tdap/Td (2 - Td or Tdap) 01/01/2023   INFLUENZA VACCINE  03/16/2023 (Originally 07/16/2022)   COLONOSCOPY (Pts 45-69yr Insurance coverage will need to be confirmed)  04/17/2023 (Originally 09/22/2005)   Zoster Vaccines- Shingrix (2 of 2) 07/17/2023 (Originally 08/27/2021)   COVID-19 Vaccine (1) 02/29/2024 (Originally 03/23/1962)   Hepatitis C Screening  Completed   HIV Screening  Completed   HPV VACCINES  Aged Out     ----------------------------------------------------------------------------------------------------------------------------------------------------------------------------------------------------------------- Physical Exam BP 112/72 (BP Location: Right Arm, Patient Position: Sitting, Cuff Size: Large)   Pulse (!) 56   Ht '6\' 2"'$  (1.88 m)   Wt 300 lb (136.1 kg)   SpO2 96%   BMI 38.52 kg/m   Physical Exam Constitutional:      Appearance: Normal appearance.  HENT:     Head: Normocephalic and atraumatic.  Eyes:     General: No scleral icterus. Cardiovascular:     Rate and Rhythm: Normal rate and regular rhythm.  Pulmonary:     Effort: Pulmonary effort is normal.  Breath sounds: Normal breath sounds.  Neurological:     Mental Status: He is alert.  Psychiatric:        Mood and Affect: Mood normal.        Behavior: Behavior normal.     ------------------------------------------------------------------------------------------------------------------------------------------------------------------------------------------------------------------- Assessment and Plan  HTN (hypertension), benign Blood pressure mains well-controlled.  Recommend continuation of current medications for management of hypertension.    Coronary artery disease Seeing cardiology previously but has not  been seen in quite some time.  Referral placed to new cardiologist.  Has not taken Praluent in some time.  Reports she cannot tolerate statins.  We may need to look into patient assistance for him to have [place] or Praluent.   Prediabetes Mounjaro proved to be too expensive.  He will continue to work on dietary change.  Psoriasis Clobetasol foam renewed.  Calcipotriene was too expensive.  Bilateral primary osteoarthritis of knee Will plan to follow-up with Dr. Dianah Field for this.   Meds ordered this encounter  Medications   sildenafil (REVATIO) 20 MG tablet    Sig: Take 40-'100mg'$  daily as needed for ED symptoms.    Dispense:  50 tablet    Refill:  1    Using GoodRx   albuterol (VENTOLIN HFA) 108 (90 Base) MCG/ACT inhaler    Sig: TAKE 2 PUFFS BY MOUTH EVERY 6 HOURS AS NEEDED FOR WHEEZE OR SHORTNESS OF BREATH    Dispense:  8.5 each    Refill:  0    Please substitute for generic Albuterol HFA if cheaper.  Using GoodRx   clobetasol (OLUX) 0.05 % topical foam    Sig: Apply topically 2 (two) times daily.    Dispense:  50 g    Refill:  3    Using GoodRx    Return in about 3 months (around 05/14/2023) for Annual exam/fasting labs.    This visit occurred during the SARS-CoV-2 public health emergency.  Safety protocols were in place, including screening questions prior to the visit, additional usage of staff PPE, and extensive cleaning of exam room while observing appropriate contact time as indicated for disinfecting solutions.

## 2023-02-13 NOTE — Assessment & Plan Note (Signed)
Blood pressure mains well-controlled.  Recommend continuation of current medications for management of hypertension.

## 2023-02-13 NOTE — Assessment & Plan Note (Signed)
Will plan to follow-up with Dr. Dianah Field for this.

## 2023-02-13 NOTE — Assessment & Plan Note (Signed)
Clobetasol foam renewed.  Calcipotriene was too expensive.

## 2023-03-10 ENCOUNTER — Other Ambulatory Visit: Payer: Self-pay | Admitting: Family Medicine

## 2023-03-10 DIAGNOSIS — Z951 Presence of aortocoronary bypass graft: Secondary | ICD-10-CM

## 2023-03-10 DIAGNOSIS — I10 Essential (primary) hypertension: Secondary | ICD-10-CM

## 2023-03-12 ENCOUNTER — Ambulatory Visit: Payer: 59 | Admitting: Sports Medicine

## 2023-03-26 DIAGNOSIS — M109 Gout, unspecified: Secondary | ICD-10-CM | POA: Insufficient documentation

## 2023-03-26 DIAGNOSIS — I219 Acute myocardial infarction, unspecified: Secondary | ICD-10-CM | POA: Insufficient documentation

## 2023-04-03 ENCOUNTER — Ambulatory Visit: Payer: 59 | Attending: Cardiology | Admitting: Cardiology

## 2023-04-03 ENCOUNTER — Encounter: Payer: Self-pay | Admitting: Cardiology

## 2023-04-03 VITALS — BP 112/70 | HR 60 | Ht 74.0 in | Wt 302.0 lb

## 2023-04-03 DIAGNOSIS — Z951 Presence of aortocoronary bypass graft: Secondary | ICD-10-CM

## 2023-04-03 DIAGNOSIS — R0609 Other forms of dyspnea: Secondary | ICD-10-CM | POA: Insufficient documentation

## 2023-04-03 DIAGNOSIS — G4733 Obstructive sleep apnea (adult) (pediatric): Secondary | ICD-10-CM

## 2023-04-03 DIAGNOSIS — R7303 Prediabetes: Secondary | ICD-10-CM

## 2023-04-03 DIAGNOSIS — I10 Essential (primary) hypertension: Secondary | ICD-10-CM

## 2023-04-03 DIAGNOSIS — I2510 Atherosclerotic heart disease of native coronary artery without angina pectoris: Secondary | ICD-10-CM | POA: Diagnosis not present

## 2023-04-03 DIAGNOSIS — Z1321 Encounter for screening for nutritional disorder: Secondary | ICD-10-CM

## 2023-04-03 MED ORDER — CLOPIDOGREL BISULFATE 75 MG PO TABS
75.0000 mg | ORAL_TABLET | Freq: Every day | ORAL | 3 refills | Status: AC
Start: 2023-04-03 — End: ?

## 2023-04-03 MED ORDER — NITROGLYCERIN 0.4 MG SL SUBL: 0.4000 mg | SUBLINGUAL_TABLET | SUBLINGUAL | 6 refills | Status: AC | PRN

## 2023-04-03 NOTE — Patient Instructions (Addendum)
Medication Instructions:  Your physician has recommended you make the following change in your medication:   Start 75 mg Plavix daily.  Use nitroglycerin 1 tablet placed under the tongue at the first sign of chest pain or an angina attack. 1 tablet may be used every 5 minutes as needed, for up to 15 minutes. Do not take more than 3 tablets in 15 minutes. If pain persist call 911 or go to the nearest ED.   *If you need a refill on your cardiac medications before your next appointment, please call your pharmacy*   Lab Work: Your physician recommends that you return for lab work in: the next few days for CMP and lipids. You need to have labs done when you are fasting. [facility] lab is located on the 3rd floor, [address]. Hours are Monday - Friday 8 am to 4 pm, closed 11:30 am to 1:00 pm. You do NOT need an appointment.   If you have labs (blood work) drawn today and your tests are completely normal, you will receive your results only by: MyChart Message (if you have MyChart) OR A paper copy in the mail If you have any lab test that is abnormal or we need to change your treatment, we will call you to review the results.   Testing/Procedures: You are scheduled for a Myocardial Perfusion Imaging Study.  Please arrive 15 minutes prior to your appointment time for registration and insurance purposes.  The test will be done over 2 days and take approximately 3 to 4 hours each day to complete; you may bring reading material.  If someone comes with you to your appointment, they will need to remain in the main lobby due to limited space in the testing area.   How to prepare for your Myocardial Perfusion Test: Do not eat or drink 3 hours prior to your test, except you may have water. Do not consume products containing caffeine (regular or decaffeinated) 12 hours prior to your test. (ex: coffee, chocolate, sodas, tea). Do bring a list of your current medications with you.  If not listed below, you may  take your medications as normal. Do wear comfortable clothes (no dresses or overalls) and walking shoes, tennis shoes preferred (No heels or open toe shoes are allowed). Do NOT wear cologne, perfume, aftershave, or lotions (deodorant is allowed). If these instructions are not followed, your test will have to be rescheduled.  If you cannot keep your appointment, please provide 24 hours notification to the [facility], to avoid a possible $50 charge to your account.   Your physician has requested that you have an echocardiogram. Echocardiography is a painless test that uses sound waves to create images of your heart. It provides your doctor with information about the size and shape of your heart and how well your heart's chambers and valves are working. This procedure takes approximately one hour. There are no restrictions for this procedure. Please do NOT wear cologne, perfume, aftershave, or lotions (deodorant is allowed). Please arrive 15 minutes prior to your appointment time.   Follow-Up: At [facility], you and your health needs are our priority.  As part of our continuing mission to provide you with exceptional heart care, we have created designated Provider Care Teams.  These Care Teams include your primary Cardiologist (physician) and Advanced Practice Providers (APPs -  Physician Assistants and Nurse Practitioners) who all work together to provide you with the care you need, when you need it.  We recommend signing  up for the patient portal called "MyChart".  Sign up information is provided on this After Visit Summary.  MyChart is used to connect with patients for Virtual Visits (Telemedicine).  Patients are able to view lab/test results, encounter notes, upcoming appointments, etc.  Non-urgent messages can be sent to your provider as well.   To learn more about what you can do with MyChart, go to [email or web address].    Your next appointment:   9 month(s)  Provider:    Belva Crome, MD   Other Instructions  Cardiac Nuclear Scan A cardiac nuclear scan is a test that is done to check the flow of blood to your heart. It is done when you are resting and when you are exercising. The test looks for problems such as: Not enough blood reaching a portion of the heart. The heart muscle not working as it should. You may need this test if you have: Heart disease. Lab results that are not normal. Had heart surgery or a balloon procedure to open up blocked arteries (angioplasty) or a small mesh tube (stent). Chest pain. Shortness of breath. Had a heart attack. In this test, a special dye (tracer) is put into your bloodstream. The tracer will travel to your heart. A camera will then take pictures of your heart to see how the tracer moves through your heart. This test is usually done at a hospital and takes 2-4 hours. Tell a doctor about: Any allergies you have. All medicines you are taking, including vitamins, herbs, eye drops, creams, and over-the-counter medicines. Any bleeding problems you have. Any surgeries you have had. Any medical conditions you have. Whether you are pregnant or may be pregnant. Any history of asthma or long-term (chronic) lung disease. Any history of heart rhythm disorders or heart valve conditions. What are the risks? Your doctor will talk with you about risks. These may include: Serious chest pain and heart attack. This is only a risk if the stress portion of the test is done. Fast or uneven heartbeats (palpitations). A feeling of warmth in your chest. This feeling usually does not last long. Allergic reaction to the tracer. Shortness of breath or trouble breathing. What happens before the test? Ask your doctor about changing or stopping your normal medicines. Follow instructions from your doctor about what you cannot eat or drink. Remove your jewelry on the day of the test. Ask your doctor if you need to avoid nicotine or  caffeine. What happens during the test? An IV tube will be inserted into one of your veins. Your doctor will give you a small amount of tracer through the IV tube. You will wait for 20-40 minutes while the tracer moves through your bloodstream. Your heart will be monitored with an electrocardiogram (ECG). You will lie down on an exam table. Pictures of your heart will be taken for about 15-20 minutes. You may also have a stress test. For this test, one of these things may be done: You will be asked to exercise on a treadmill or a stationary bike. You will be given medicines that will make your heart work harder. This is done if you are unable to exercise. When blood flow to your heart has peaked, a tracer will again be given through the IV tube. After 20-40 minutes, you will get back on the exam table. More pictures will be taken of your heart. Depending on the tracer that is used, more pictures may need to be taken 3-4 hours later. Your IV tube will  be removed when the test is over. The test may vary among doctors and hospitals. What happens after the test? Ask your doctor: Whether you can return to your normal schedule, including diet, activities, travel, and medicines. Whether you should drink more fluids. This will help to remove the tracer from your body. Ask your doctor, or the department that is doing the test: When will my results be ready? How will I get my results? What are my treatment options? What other tests do I need? What are my next steps? This information is not intended to replace advice given to you by your health care provider. Make sure you discuss any questions you have with your health care provider. Document Revised: 04/30/2022 Document Reviewed: 04/30/2022 Elsevier Patient Education  2023 Elsevier Inc.  Echocardiogram An echocardiogram is a test that uses sound waves (ultrasound) to produce images of the heart. Images from an echocardiogram can provide  important information about: Heart size and shape. The size and thickness and movement of your heart's walls. Heart muscle function and strength. Heart valve function or if you have stenosis. Stenosis is when the heart valves are too narrow. If blood is flowing backward through the heart valves (regurgitation). A tumor or infectious growth around the heart valves. Areas of heart muscle that are not working well because of poor blood flow or injury from a heart attack. Aneurysm detection. An aneurysm is a weak or damaged part of an artery wall. The wall bulges out from the normal force of blood pumping through the body. Tell a health care provider about: Any allergies you have. All medicines you are taking, including vitamins, herbs, eye drops, creams, and over-the-counter medicines. Any blood disorders you have. Any surgeries you have had. Any medical conditions you have. Whether you are pregnant or may be pregnant. What are the risks? Generally, this is a safe test. However, problems may occur, including an allergic reaction to dye (contrast) that may be used during the test. What happens before the test? No specific preparation is needed. You may eat and drink normally. What happens during the test?  You will take off your clothes from the waist up and put on a hospital gown. Electrodes or electrocardiogram (ECG)patches may be placed on your chest. The electrodes or patches are then connected to a device that monitors your heart rate and rhythm. You will lie down on a table for an ultrasound exam. A gel will be applied to your chest to help sound waves pass through your skin. A handheld device, called a transducer, will be pressed against your chest and moved over your heart. The transducer produces sound waves that travel to your heart and bounce back (or "echo" back) to the transducer. These sound waves will be captured in real-time and changed into images of your heart that can be  viewed on a video monitor. The images will be recorded on a computer and reviewed by your health care provider. You may be asked to change positions or hold your breath for a short time. This makes it easier to get different views or better views of your heart. In some cases, you may receive contrast through an IV in one of your veins. This can improve the quality of the pictures from your heart. The procedure may vary among health care providers and hospitals. What can I expect after the test? You may return to your normal, everyday life, including diet, activities, and medicines, unless your health care provider tells you not  to do that. Follow these instructions at home: It is up to you to get the results of your test. Ask your health care provider, or the department that is doing the test, when your results will be ready. Keep all follow-up visits. This is important. Summary An echocardiogram is a test that uses sound waves (ultrasound) to produce images of the heart. Images from an echocardiogram can provide important information about the size and shape of your heart, heart muscle function, heart valve function, and other possible heart problems. You do not need to do anything to prepare before this test. You may eat and drink normally. After the echocardiogram is completed, you may return to your normal, everyday life, unless your health care provider tells you not to do that. This information is not intended to replace advice given to you by your health care provider. Make sure you discuss any questions you have with your health care provider. Document Revised: 08/15/2021 Document Reviewed: 07/25/2020 Elsevier Patient Education  2023 [employer].

## 2023-04-03 NOTE — Progress Notes (Signed)
Cardiology Office Note:    Date:  04/03/2023   ID:  Jon Mcbride, DOB 11-Jan-1961, MRN [id number]  PCP:  Everrett Coombe, DO  Cardiologist:  Garwin Brothers, MD   Referring MD: Everrett Coombe, DO    ASSESSMENT:    1. Coronary artery disease involving native coronary artery of native heart without angina pectoris   2. OSA (obstructive sleep apnea)   3. Morbid obesity   4. History of quadruple bypass   5. DOE (dyspnea on exertion)    PLAN:    In order of problems listed above:  Dyspnea on exertion: Coronary artery disease: Secondary prevention stressed with the patient.  Importance of compliance with diet medication stressed and vocalized understanding.  Will do a Lexiscan stress to assess his symptoms. Cardiac murmur: Echocardiogram will be done to assess murmur heard on auscultation. Sublingual nitroglycerin prescription was sent, its protocol and 911 protocol explained and the patient vocalized understanding questions were answered to the patient's satisfaction.  He tells me he has gastritis from aspirin so I discussed clopidogrel and he is agreeable.  Will initiate this for him.  Benefits risks explained. Mixed dyslipidemia: He is not intolerant to statins in the past.  No particular specific issue as myalgias.  He is willing to try them.  Will do blood work today in the next few days.  Will initiate him on low-dose pitavastatin.  I also advised about co-Q10.  He is agreeable. Obesity: Weight reduction stressed and diet was emphasized and he promises to do better. Patient will be seen in follow-up appointment in 6 months or earlier if the patient has any concerns.    Medication Adjustments/Labs and Tests Ordered: Current medicines are reviewed at length with the patient today.  Concerns regarding medicines are outlined above.  No orders of the defined types were placed in this encounter.  No orders of the defined types were placed in this encounter.    History of Present  Illness:    Jon Mcbride is a 62 y.o. male who is being seen today for the evaluation of dyspnea on exertion and coronary artery disease at the request of Everrett Coombe, DO.  Patient is a pleasant 62 year old male.  He has a past medical history of coronary artery disease post CABG surgery about 6 years ago at [facility], essential hypertension, mixed dyslipidemia and obesity.  He leads a sedentary lifestyle because of orthopedic issues involving his knees.  He denies any chest pain orthopnea or PND.  He has some shortness of breath on exertion.  At the time of my evaluation, the patient is alert awake oriented and in no distress.  Past Medical History:  Diagnosis Date   Acute gout of left wrist 04/12/2019   Allergic sinusitis 04/16/2022   Bilateral primary osteoarthritis of knee 10/07/2018   Coronary artery disease 10/15/2022   Erectile dysfunction 10/15/2022   Gout    H/O myocardial infarction, greater than 8 weeks 01/01/2013   Heart attack    History of quadruple bypass 06/03/2017   HTN (hypertension), benign 01/01/2013   Morbid obesity 10/06/2018   OSA (obstructive sleep apnea) 02/26/2018   Prediabetes 10/15/2022   Psoriasis 11/06/2018   Stented coronary artery 01/01/2013   Overview:   Drug-eluding.    Past Surgical History:  Procedure Laterality Date   CAROTID STENT INSERTION     CORONARY ARTERY BYPASS GRAFT      Current Medications: Current Meds  Medication Sig   albuterol (VENTOLIN HFA) 108 (90 Base) MCG/ACT  inhaler TAKE 2 PUFFS BY MOUTH EVERY 6 HOURS AS NEEDED FOR WHEEZE OR SHORTNESS OF BREATH   allopurinol (ZYLOPRIM) 300 MG tablet TAKE 1 TABLET BY MOUTH EVERY DAY   clobetasol (OLUX) 0.05 % topical foam Apply topically 2 (two) times daily.   irbesartan (AVAPRO) 75 MG tablet TAKE 1 TABLET BY MOUTH EVERY DAY   metoprolol succinate (TOPROL-XL) 25 MG 24 hr tablet TAKE 1 TABLET (25 MG TOTAL) BY MOUTH DAILY.   sildenafil (REVATIO) 20 MG tablet Take 40-100mg  daily as  needed for ED symptoms.     Allergies:   Patient has no known allergies.   Social History   Socioeconomic History   Marital status: Divorced    Spouse name: Not on file   Number of children: Not on file   Years of education: Not on file   Highest education level: Not on file  Occupational History   Not on file  Tobacco Use   Smoking status: Never   Smokeless tobacco: Never  Substance and Sexual Activity   Alcohol use: No   Drug use: No   Sexual activity: Not on file  Other Topics Concern   Not on file  Social History Narrative   Not on file   Social Determinants of Health   Financial Resource Strain: Not on file  Food Insecurity: Not on file  Transportation Needs: Not on file  Physical Activity: Not on file  Stress: Not on file  Social Connections: Not on file     Family History: The patient's family history includes Cancer in his mother; Hyperlipidemia in his father and mother; Hypertension in his father.  ROS:   Please see the history of present illness.    All other systems reviewed and are negative.  EKGs/Labs/Other Studies Reviewed:    The following studies were reviewed today: EKG reveals sinus rhythm poor anterior forces and nonspecific ST-T changes   Recent Labs: 04/16/2022: TSH 3.91  Recent Lipid Panel    Component Value Date/Time   CHOL 210 (H) 04/26/2021 0938   TRIG 122 04/26/2021 0938   HDL 35 (L) 04/26/2021 0938   CHOLHDL 6.0 (H) 04/26/2021 0938   VLDL 54 (H) 06/03/2017 1529   LDLCALC 151 (H) 04/26/2021 0938    Physical Exam:    VS:  BP 112/70   Pulse 60   Ht 6\' 2"  (1.88 m)   Wt (!) 302 lb (137 kg)   SpO2 95%   BMI 38.77 kg/m     Wt Readings from Last 3 Encounters:  04/03/23 (!) 302 lb (137 kg)  02/13/23 300 lb (136.1 kg)  10/15/22 298 lb (135.2 kg)     GEN: Patient is in no acute distress HEENT: Normal NECK: No JVD; No carotid bruits LYMPHATICS: No lymphadenopathy CARDIAC: S1 S2 regular, 2/6 systolic murmur at the  apex. RESPIRATORY:  Clear to auscultation without rales, wheezing or rhonchi  ABDOMEN: Soft, non-tender, non-distended MUSCULOSKELETAL:  No edema; No deformity  SKIN: Warm and dry NEUROLOGIC:  Alert and oriented x 3 PSYCHIATRIC:  Normal affect    Signed, Garwin Brothers, MD  04/03/2023 10:56 AM     [facility]

## 2023-04-23 ENCOUNTER — Ambulatory Visit (HOSPITAL_BASED_OUTPATIENT_CLINIC_OR_DEPARTMENT_OTHER): Payer: 59

## 2023-04-28 ENCOUNTER — Encounter (HOSPITAL_COMMUNITY): Payer: 59

## 2023-05-14 ENCOUNTER — Ambulatory Visit: Payer: 59 | Admitting: Family Medicine

## 2023-05-21 ENCOUNTER — Ambulatory Visit: Payer: 59 | Admitting: Family Medicine

## 2023-05-21 ENCOUNTER — Encounter: Payer: Self-pay | Admitting: Family Medicine

## 2023-05-21 VITALS — BP 112/70 | HR 52 | Ht 74.0 in | Wt 286.0 lb

## 2023-05-21 DIAGNOSIS — Z0000 Encounter for general adult medical examination without abnormal findings: Secondary | ICD-10-CM

## 2023-05-21 DIAGNOSIS — R7303 Prediabetes: Secondary | ICD-10-CM | POA: Diagnosis not present

## 2023-05-21 DIAGNOSIS — I10 Essential (primary) hypertension: Secondary | ICD-10-CM

## 2023-05-21 DIAGNOSIS — I25810 Atherosclerosis of coronary artery bypass graft(s) without angina pectoris: Secondary | ICD-10-CM | POA: Diagnosis not present

## 2023-05-21 DIAGNOSIS — Z1211 Encounter for screening for malignant neoplasm of colon: Secondary | ICD-10-CM

## 2023-05-21 DIAGNOSIS — Z23 Encounter for immunization: Secondary | ICD-10-CM | POA: Diagnosis not present

## 2023-05-21 LAB — CBC WITH DIFFERENTIAL/PLATELET
Absolute Monocytes: 650 cells/uL (ref 200–950)
Eosinophils Relative: 5.9 %
HCT: 44.1 % (ref 38.5–50.0)
Neutrophils Relative %: 54.8 %
Platelets: 327 10*3/uL (ref 140–400)
RBC: 4.74 10*6/uL (ref 4.20–5.80)
Total Lymphocyte: 28.4 %

## 2023-05-21 NOTE — Progress Notes (Signed)
Jon Mcbride - 62 y.o. male MRN [id number]  Date of birth: 03/26/1961  Subjective Chief Complaint  Patient presents with   Annual Exam    HPI Jon Mcbride is a 62 y.o. male here today for annual exam.   He was seen by cardiology in April but reports that this appt didn't go well.  Did not care for for cardiologists bedside manner.  Cardiologist did order labs, Myoview and Echo but he did not have these completed due to his experience there.  He has been working on diet and lifestyle change. Weight is down about 16lbs since April.  Remains on plavix and metoprolol.  Denies new anginal symptoms.   He is a non-smoker.  Denies EtOH use at this time.   Due for updated cologuard. Tdap due.   Review of Systems  Constitutional:  Negative for chills, fever, malaise/fatigue and weight loss.  HENT:  Negative for congestion, ear pain and sore throat.   Eyes:  Negative for blurred vision, double vision and pain.  Respiratory:  Negative for cough and shortness of breath.   Cardiovascular:  Negative for chest pain and palpitations.  Gastrointestinal:  Negative for abdominal pain, blood in stool, constipation, heartburn and nausea.  Genitourinary:  Negative for dysuria and urgency.  Musculoskeletal:  Negative for joint pain and myalgias.  Neurological:  Negative for dizziness and headaches.  Endo/Heme/Allergies:  Does not bruise/bleed easily.  Psychiatric/Behavioral:  Negative for depression. The patient is not nervous/anxious and does not have insomnia.      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    Past Surgical History:  Procedure Laterality Date   CAROTID STENT INSERTION     CORONARY ARTERY BYPASS GRAFT      Social History   Socioeconomic History   Marital status: Divorced    Spouse name: Not on file   Number of children: Not on file   Years of education: Not on file   Highest education level: Not on file  Occupational History   Not on file  Tobacco Use   Smoking status: Never   Smokeless tobacco: Never  Substance and Sexual Activity   Alcohol use: No   Drug use: No   Sexual activity: Not on file  Other Topics Concern   Not on file  Social History Narrative   Not on file   Social Determinants of Health   Financial Resource Strain: Not on file  Food Insecurity: Not on file  Transportation Needs: Not on file  Physical Activity: Not on file  Stress: Not on file  Social Connections: Not on file    Family History  Problem Relation Age of Onset   Hypertension Father    Hyperlipidemia Father    Cancer Mother    Hyperlipidemia Mother     Health Maintenance  Topic Date Due   Fecal DNA (Cologuard)  Never done   Zoster Vaccines- Shingrix (2  of 2) 07/17/2023 (Originally 08/27/2021)   COVID-19 Vaccine (1) 02/29/2024 (Originally 03/23/1962)   INFLUENZA VACCINE  07/17/2023   DTaP/Tdap/Td (3 - Td or Tdap) 05/20/2033   Hepatitis C Screening  Completed   HIV Screening  Completed   HPV VACCINES  Aged Out     ----------------------------------------------------------------------------------------------------------------------------------------------------------------------------------------------------------------- Physical Exam BP 112/70 (BP Location: Left Arm, Patient Position: Sitting, Cuff Size: Large)   Pulse (!) 52   Ht 6\' 2"  (1.88 m)   Wt 286 lb (129.7 kg)   SpO2  97%   BMI 36.72 kg/m   Physical Exam Constitutional:      General: He is not in acute distress. HENT:     Head: Normocephalic and atraumatic.     Right Ear: Tympanic membrane and external ear normal.     Left Ear: Tympanic membrane and external ear normal.  Eyes:     General: No scleral icterus. Neck:     Thyroid: No thyromegaly.  Cardiovascular:     Rate and Rhythm: Normal rate and regular rhythm.     Heart sounds: Normal heart sounds.  Pulmonary:     Effort: Pulmonary effort is normal.     Breath sounds: Normal breath sounds.  Abdominal:     General: Bowel sounds are normal. There is no distension.     Palpations: Abdomen is soft.     Tenderness: There is no abdominal tenderness. There is no guarding.  Musculoskeletal:     Cervical back: Normal range of motion.  Lymphadenopathy:     Cervical: No cervical adenopathy.  Skin:    General: Skin is warm and dry.     Findings: No rash.  Neurological:     Mental Status: He is alert and oriented to person, place, and time.     Cranial Nerves: No cranial nerve deficit.     Motor: No abnormal muscle tone.  Psychiatric:        Mood and Affect: Mood normal.        Behavior: Behavior normal.     ------------------------------------------------------------------------------------------------------------------------------------------------------------------------------------------------------------------- Assessment and Plan  Well adult exam Well adult Orders Placed This Encounter  Procedures   Tdap vaccine greater than or equal to 7yo IM   Cologuard   COMPLETE METABOLIC PANEL WITH GFR   CBC with Differential   Lipid Panel w/reflex Direct LDL   TSH   Vitamin D (25 hydroxy)   HgB A1c  Screenings:  Per lab orders.  Reminded to completed cologuard.  Immunizations: Tdap given.  Anticipatory guidance/Risk factor reduction:  Recommendations per AVS.    No orders of the defined types were placed in this encounter.   No  follow-ups on file.    This visit occurred during the SARS-CoV-2 public health emergency.  Safety protocols were in place, including screening questions prior to the visit, additional usage of staff PPE, and extensive cleaning of exam room while observing appropriate contact time as indicated for disinfecting solutions.

## 2023-05-21 NOTE — Patient Instructions (Signed)

## 2023-05-21 NOTE — Assessment & Plan Note (Addendum)
Well adult Orders Placed This Encounter  Procedures   Tdap vaccine greater than or equal to 63yo IM   Cologuard   COMPLETE METABOLIC PANEL WITH GFR   CBC with Differential   Lipid Panel w/reflex Direct LDL   TSH   Vitamin D (25 hydroxy)   HgB A1c  Screenings:  Per lab orders.  Reminded to completed cologuard.  Immunizations: Tdap given.  Anticipatory guidance/Risk factor reduction:  Recommendations per AVS.

## 2023-05-22 LAB — CBC WITH DIFFERENTIAL/PLATELET
Basophils Absolute: 59 cells/uL (ref 0–200)
Basophils Relative: 0.9 %
Eosinophils Absolute: 384 cells/uL (ref 15–500)
Hemoglobin: 14.4 g/dL (ref 13.2–17.1)
Lymphs Abs: 1846 cells/uL (ref 850–3900)
MCH: 30.4 pg (ref 27.0–33.0)
MCHC: 32.7 g/dL (ref 32.0–36.0)
MCV: 93 fL (ref 80.0–100.0)
MPV: 10.1 fL (ref 7.5–12.5)
Monocytes Relative: 10 %
Neutro Abs: 3562 cells/uL (ref 1500–7800)
RDW: 12.4 % (ref 11.0–15.0)
WBC: 6.5 10*3/uL (ref 3.8–10.8)

## 2023-05-22 LAB — LIPID PANEL W/REFLEX DIRECT LDL
Cholesterol: 165 mg/dL (ref <200)
HDL: 28 mg/dL — ABNORMAL LOW (ref >=40)
LDL Cholesterol (Calc): 113 mg/dL (calc) — ABNORMAL HIGH
Non-HDL Cholesterol (Calc): 137 mg/dL (calc) — ABNORMAL HIGH (ref <130)
Total CHOL/HDL Ratio: 5.9 (calc) — ABNORMAL HIGH (ref <5.0)
Triglycerides: 126 mg/dL (ref <150)

## 2023-05-22 LAB — COMPLETE METABOLIC PANEL WITH GFR
AG Ratio: 1.5 (calc) (ref 1.0–2.5)
ALT: 35 U/L (ref 9–46)
AST: 30 U/L (ref 10–35)
Albumin: 4.2 g/dL (ref 3.6–5.1)
Alkaline phosphatase (APISO): 57 U/L (ref 35–144)
BUN: 22 mg/dL (ref 7–25)
CO2: 24 mmol/L (ref 20–32)
Calcium: 9 mg/dL (ref 8.6–10.3)
Chloride: 106 mmol/L (ref 98–110)
Creat: 1.02 mg/dL (ref 0.70–1.35)
Globulin: 2.8 g/dL (calc) (ref 1.9–3.7)
Glucose, Bld: 85 mg/dL (ref 65–99)
Potassium: 4.9 mmol/L (ref 3.5–5.3)
Sodium: 141 mmol/L (ref 135–146)
Total Bilirubin: 0.6 mg/dL (ref 0.2–1.2)
Total Protein: 7 g/dL (ref 6.1–8.1)
eGFR: 83 mL/min/{1.73_m2} (ref >=60)

## 2023-05-22 LAB — TSH: TSH: 2.55 mIU/L (ref 0.40–4.50)

## 2023-05-22 LAB — HEMOGLOBIN A1C
Hgb A1c MFr Bld: 5.7 % of total Hgb — ABNORMAL HIGH (ref <5.7)
Mean Plasma Glucose: 117 mg/dL
eAG (mmol/L): 6.5 mmol/L

## 2023-05-22 LAB — VITAMIN D 25 HYDROXY (VIT D DEFICIENCY, FRACTURES): Vit D, 25-Hydroxy: 36 ng/mL (ref 30–100)

## 2023-06-06 ENCOUNTER — Ambulatory Visit
Admission: EM | Admit: 2023-06-06 | Discharge: 2023-06-06 | Disposition: A | Discharge status: Home / Self Care | Payer: 59 | Attending: Family Medicine | Admitting: Family Medicine

## 2023-06-06 DIAGNOSIS — J029 Acute pharyngitis, unspecified: Secondary | ICD-10-CM | POA: Insufficient documentation

## 2023-06-06 DIAGNOSIS — J309 Allergic rhinitis, unspecified: Secondary | ICD-10-CM | POA: Insufficient documentation

## 2023-06-06 LAB — POCT RAPID STREP A (OFFICE): Rapid Strep A Screen: NEGATIVE

## 2023-06-06 LAB — POC SARS CORONAVIRUS 2 AG -  ED: SARS Coronavirus 2 Ag: NEGATIVE

## 2023-06-06 MED ORDER — FEXOFENADINE HCL 180 MG PO TABS: 180.0000 mg | ORAL_TABLET | Freq: Every day | ORAL | 0 refills | Status: AC

## 2023-06-06 NOTE — Discharge Instructions (Addendum)
Advised patient rapid strep and COVID-19 test were negative today.  Advised we will follow-up with throat culture results once received.  Advised patient may take Allegra daily for the next 5 days for concurrent postnasal drainage/drip.  Encouraged increase daily water intake to 64 ounces per day while taking this medication.  Advised if symptoms worsen and/or unresolved please follow-up with PCP or here for further evaluation.

## 2023-06-06 NOTE — ED Provider Notes (Signed)
[facility]    CSN: [id number] Arrival date & time: 06/06/23  1036      History   Chief Complaint Chief Complaint  Patient presents with   Sore Throat    HPI Jon Mcbride is a 62 y.o. male.   HPI 63 year old male presents with sore throat since yesterday reports history of allergies.  Request strep testing and possible COVID-19 testing.  Reports taking OTC Mucinex and ibuprofen as needed.  PMH significant for CAD (s/p MI CABG x 4), morbid obesity, HTN, and OSA.  Patient is currently on Plavix daily and denies any unusual bleeding.  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 Authorizing Provider  fexofenadine (ALLEGRA ALLERGY) 180 MG tablet Take 1 tablet (180 mg total) by mouth daily for 15 days. 06/06/23 06/21/23 Yes Trevor Iha, FNP  albuterol (VENTOLIN HFA) 108 (90 Base) MCG/ACT inhaler TAKE 2 PUFFS BY MOUTH EVERY 6 HOURS AS NEEDED FOR WHEEZE OR SHORTNESS OF BREATH 02/13/23   Everrett Coombe, DO  allopurinol (ZYLOPRIM) 300 MG tablet TAKE 1 TABLET BY MOUTH EVERY DAY 03/12/23   Everrett Coombe, DO  clobetasol (OLUX) 0.05 % topical foam Apply topically 2 (two) times daily. 02/13/23   Everrett Coombe, DO  clopidogrel (PLAVIX) 75 MG tablet Take 1 tablet (75 mg total) by mouth daily. 04/03/23   Revankar, Aundra Dubin, MD  irbesartan (AVAPRO) 75 MG tablet TAKE 1 TABLET BY MOUTH EVERY DAY 03/12/23   Everrett Coombe, DO  metoprolol succinate (TOPROL-XL) 25 MG 24 hr tablet TAKE 1 TABLET (25 MG TOTAL) BY MOUTH DAILY. 03/12/23   Everrett Coombe, DO  nitroGLYCERIN (NITROSTAT) 0.4 MG SL tablet Place 1 tablet (0.4 mg total) under the tongue every 5 (five) minutes as needed. 04/03/23 07/02/23  Revankar, Aundra Dubin, MD  sildenafil (REVATIO) 20 MG tablet Take 40-100mg  daily as needed for ED symptoms. 02/13/23   Everrett Coombe, DO    Family History Family History  Problem Relation Age of Onset  Hypertension Father    Hyperlipidemia Father    Cancer Mother    Hyperlipidemia Mother     Social History Social History   Tobacco Use   Smoking status: Never   Smokeless tobacco: Never  Substance Use Topics   Alcohol use: No   Drug use: No     Allergies   Patient has no known allergies.   Review of Systems Review of Systems  HENT:  Positive for postnasal drip and sore throat.   All other systems reviewed and are negative.    Physical Exam Triage Vital Signs ED Triage Vitals  Enc Vitals Group     BP 06/06/23 1056 131/82      Pulse Rate 06/06/23 1056 68     Resp 06/06/23 1056 17     Temp 06/06/23 1056 98.2 F (36.8 C)     Temp Source 06/06/23 1056 Oral     SpO2 06/06/23 1056 97 %     Weight --      Height --      Head Circumference --      Peak Flow --      Pain Score 06/06/23 1055 7     Pain Loc --      Pain Edu? --      Excl. in GC? --    No data found.  Updated Vital Signs BP 131/82 (BP Location: Right Arm)   Pulse 68   Temp 98.2 F (36.8 C) (Oral)   Resp 17   SpO2 97%      Physical Exam Vitals and nursing note reviewed.  Constitutional:      Appearance: He is obese. He is not ill-appearing.  HENT:     Head: Normocephalic and atraumatic.     Right Ear: Tympanic membrane, ear canal and external ear normal.     Left Ear: Tympanic membrane, ear canal and external ear normal.     Mouth/Throat:     Mouth: Mucous membranes are moist.     Pharynx: Oropharynx is clear.     Comments: Significant amount of clear drainage of posterior oropharynx noted Eyes:     Extraocular Movements: Extraocular movements intact.     Conjunctiva/sclera: Conjunctivae normal.     Pupils: Pupils are equal, round, and reactive to light.  Cardiovascular:     Rate and Rhythm: Normal rate and regular rhythm.     Pulses: Normal pulses.     Heart sounds: Normal heart sounds.  Pulmonary:     Effort: Pulmonary effort is normal.     Breath sounds: Normal breath sounds. No wheezing, rhonchi or rales.  Musculoskeletal:        General: Normal range of motion.     Cervical back: Normal range of motion and neck supple.  Skin:    General: Skin is warm and dry.  Neurological:     General: No focal deficit present.     Mental Status: He is alert and oriented to person, place, and time. Mental status is at baseline.  Psychiatric:        Mood and Affect: Mood normal.        Behavior: Behavior normal.        Thought Content: Thought content normal.      UC Treatments / Results  Labs (all labs ordered are listed, but  only abnormal results are displayed) Labs Reviewed  CULTURE, GROUP A STREP [facility])  POCT RAPID STREP A (OFFICE)  POC SARS CORONAVIRUS 2 AG -  ED  EKG   Radiology No results found.  Procedures Procedures (including critical care time)  Medications Ordered in UC Medications - No data to display  Initial Impression / Assessment and Plan / UC Course  I have reviewed the triage vital signs and the nursing notes.  Pertinent labs & imaging results that were available during my care of the patient were reviewed by me and considered in my medical decision making (see chart for details).     MDM: 1.  Acute pharyngitis, unspecified etiology, rapid strep negative, throat culture ordered, COVID-19 negative; 2.  Allergic rhinitis, unspecified seasonality, unspecified trigger-Rx'd Allegra 180 mg daily for the next 5 days, then as needed for concurrent postnasal drainage/allergic rhinitis. Advised patient rapid strep and COVID-19 test were negative today.  Advised we will follow-up with throat culture results once received.  Advised patient may take Allegra daily for the next 5 days for concurrent postnasal drainage/drip.  Encouraged increase daily water intake to 64 ounces per day while taking this medication.  Advised if symptoms worsen and/or unresolved please follow-up with PCP or here for further evaluation.  Patient discharged home, hemodynamically stable.  Final Clinical Impressions(s) / UC Diagnoses   Final diagnoses:  Acute pharyngitis, unspecified etiology  Allergic rhinitis, unspecified seasonality, unspecified trigger     Discharge Instructions      Advised patient rapid strep and COVID-19 test were negative today.  Advised we will follow-up with throat culture results once received.  Advised patient may take Allegra daily for the next 5 days for concurrent postnasal drainage/drip.  Encouraged increase daily water intake to 64 ounces per day while taking this medication.  Advised  if symptoms worsen and/or unresolved please follow-up with PCP or here for further evaluation.     ED Prescriptions     Medication Sig Dispense Auth. Provider   fexofenadine (ALLEGRA ALLERGY) 180 MG tablet Take 1 tablet (180 mg total) by mouth daily for 15 days. 15 tablet Trevor Iha, FNP      PDMP not reviewed this encounter.   Trevor Iha, FNP 06/06/23 1152

## 2023-06-06 NOTE — ED Triage Notes (Signed)
Pt c/o sore throat since yesterday. Hx of allergies. Wants strep testing. Mucinex and ibuprofen prn.

## 2023-06-08 LAB — CULTURE, GROUP A STREP (THRC)

## 2023-06-09 ENCOUNTER — Telehealth (HOSPITAL_COMMUNITY): Payer: Self-pay | Admitting: Emergency Medicine

## 2023-06-09 LAB — CULTURE, GROUP A STREP (THRC)

## 2023-06-09 MED ORDER — AZITHROMYCIN 250 MG PO TABS: 250.0000 mg | ORAL_TABLET | Freq: Every day | ORAL | 0 refills | Status: DC

## 2023-06-11 ENCOUNTER — Other Ambulatory Visit: Payer: Self-pay | Admitting: Family Medicine

## 2023-06-11 MED ORDER — ROSUVASTATIN CALCIUM 10 MG PO TABS
10.0000 mg | ORAL_TABLET | Freq: Every day | ORAL | 0 refills | Status: DC
Start: 2023-06-11 — End: 2023-08-19

## 2023-08-19 ENCOUNTER — Ambulatory Visit: Payer: 59 | Admitting: Physician Assistant

## 2023-08-19 ENCOUNTER — Encounter: Payer: Self-pay | Admitting: Physician Assistant

## 2023-08-19 VITALS — BP 143/86 | HR 89 | Temp 98.4°F | Ht 72.0 in | Wt 282.1 lb

## 2023-08-19 DIAGNOSIS — Z951 Presence of aortocoronary bypass graft: Secondary | ICD-10-CM | POA: Diagnosis not present

## 2023-08-19 DIAGNOSIS — L409 Psoriasis, unspecified: Secondary | ICD-10-CM

## 2023-08-19 DIAGNOSIS — R0609 Other forms of dyspnea: Secondary | ICD-10-CM

## 2023-08-19 DIAGNOSIS — J309 Allergic rhinitis, unspecified: Secondary | ICD-10-CM | POA: Diagnosis not present

## 2023-08-19 DIAGNOSIS — E785 Hyperlipidemia, unspecified: Secondary | ICD-10-CM

## 2023-08-19 DIAGNOSIS — R7303 Prediabetes: Secondary | ICD-10-CM | POA: Diagnosis not present

## 2023-08-19 DIAGNOSIS — I10 Essential (primary) hypertension: Secondary | ICD-10-CM | POA: Diagnosis not present

## 2023-08-19 MED ORDER — METHYLPREDNISOLONE 4 MG PO TBPK
ORAL_TABLET | ORAL | 0 refills | Status: DC
Start: 2023-08-19 — End: 2024-04-28

## 2023-08-19 MED ORDER — CLOBETASOL PROPIONATE 0.05 % EX FOAM
Freq: Two times a day (BID) | CUTANEOUS | 3 refills | Status: AC
Start: 2023-08-19 — End: ?

## 2023-08-19 MED ORDER — ALBUTEROL SULFATE HFA 108 (90 BASE) MCG/ACT IN AERS
INHALATION_SPRAY | RESPIRATORY_TRACT | 0 refills | Status: DC
Start: 2023-08-19 — End: 2023-09-23

## 2023-08-19 MED ORDER — IRBESARTAN 75 MG PO TABS
75.0000 mg | ORAL_TABLET | Freq: Every day | ORAL | 3 refills | Status: DC
Start: 2023-08-19 — End: 2024-09-13

## 2023-08-19 MED ORDER — METOPROLOL SUCCINATE ER 25 MG PO TB24
25.0000 mg | ORAL_TABLET | Freq: Every day | ORAL | 3 refills | Status: DC
Start: 2023-08-19 — End: 2024-11-10

## 2023-08-19 MED ORDER — ROSUVASTATIN CALCIUM 10 MG PO TABS
10.0000 mg | ORAL_TABLET | Freq: Every day | ORAL | 1 refills | Status: DC
Start: 2023-08-19 — End: 2024-03-23

## 2023-08-19 MED ORDER — AMOXICILLIN-POT CLAVULANATE 875-125 MG PO TABS
1.0000 | ORAL_TABLET | Freq: Two times a day (BID) | ORAL | 0 refills | Status: DC
Start: 2023-08-19 — End: 2024-04-28

## 2023-08-19 NOTE — Progress Notes (Signed)
Acute Office Visit  Subjective:     Patient ID: Jon Mcbride, male    DOB: 31-Mar-1961, 62 y.o.   MRN: [id number]  Chief Complaint  Patient presents with   Cough    HPI Patient is in today for sinus pressure, congestion, headache, sinus drainage for the last 2 weeks. He has been taking mucinex D with little relief. No fever, chills, body aches, nausea, vomiting. Negative home covid test.   PMHx of HTN, CABG, Pre-diabetes, CAD, OSA.   He needs refills of medication. Stopped taking avapro because BP running low. Only taking 1/2 of metoprolol. Wonders if he can stop both.   .. Active Ambulatory Problems    Diagnosis Date Noted   HTN (hypertension), benign 01/01/2013   Stented coronary artery 01/01/2013   Hx of CABG 06/03/2017   OSA (obstructive sleep apnea) 02/26/2018   Morbid obesity (HCC) 10/06/2018   Bilateral primary osteoarthritis of knee 10/07/2018   Psoriasis 11/06/2018   Acute gout of left wrist 04/12/2019   H/O myocardial infarction, greater than 8 weeks 01/01/2013   Allergic sinusitis 04/16/2022   Coronary artery disease 10/15/2022   Erectile dysfunction 10/15/2022   Prediabetes 10/15/2022   Gout 03/26/2023   Heart attack (HCC) 03/26/2023   DOE (dyspnea on exertion) 04/03/2023   Well adult exam 05/21/2023   Resolved Ambulatory Problems    Diagnosis Date Noted   Acute pain of right knee 10/06/2018   Cutaneous skin tags 01/23/2013   Past Medical History:  Diagnosis Date   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      Objective:    BP (!) 143/86 (BP Location: Right Arm, Patient Position: Sitting, Cuff Size: Large)   Pulse 89   Temp 98.4 F (36.9 C) (Oral)   Ht 6' (1.829 m)   Wt 282 lb 1.9 oz (128 kg)   SpO2 97%   BMI 38.26 kg/m  BP Readings from Last 3 Encounters:  08/19/23 (!) 143/86  06/06/23 131/82  05/21/23 112/70   Wt Readings from Last 3 Encounters:  08/19/23 282 lb 1.9 oz (128 kg)  05/21/23 286 lb (129.7 kg)   04/03/23 (!) 302 lb (137 kg)      Physical Exam Vitals reviewed.  Constitutional:      Appearance: Normal appearance. He is obese.  HENT:     Head: Normocephalic.     Right Ear: Tympanic membrane, ear canal and external ear normal.     Left Ear: Ear canal and external ear normal.     Ears:     Comments: Left TM erythematous.     Nose: Nose normal.     Mouth/Throat:     Mouth: Mucous membranes are moist.     Pharynx: Posterior oropharyngeal erythema present. No oropharyngeal exudate.  Eyes:     Pupils: Pupils are equal, round, and reactive to light.  Cardiovascular:     Rate and Rhythm: Normal rate and regular rhythm.  Pulmonary:     Effort: Pulmonary effort is normal.     Breath sounds: Normal breath sounds.  Musculoskeletal:     Cervical back: Normal range of motion and neck supple. No tenderness.     Right lower leg: No edema.     Left lower leg: No edema.  Lymphadenopathy:     Cervical: No cervical adenopathy.  Neurological:     General: No focal deficit present.     Mental Status: He is alert and oriented to person, place, and  time.  Psychiatric:        Mood and Affect: Mood normal.          Assessment & Plan:  Marland KitchenMarland KitchenArkin was seen today for cough.  Diagnoses and all orders for this visit:  Allergic sinusitis -     amoxicillin-clavulanate (AUGMENTIN) 875-125 MG tablet; Take 1 tablet by mouth 2 (two) times daily. -     methylPREDNISolone (MEDROL DOSEPAK) 4 MG TBPK tablet; Take as directed by package insert.  HTN (hypertension), benign -     irbesartan (AVAPRO) 75 MG tablet; Take 1 tablet (75 mg total) by mouth daily. -     metoprolol succinate (TOPROL-XL) 25 MG 24 hr tablet; Take 1 tablet (25 mg total) by mouth daily.  Hx of CABG Comments: Restarted secondary prevention with aspirin enteric-coated, ARB and beta blocker. Relative intolerance to statin, will trial pravastatin and CoQ10 Orders: -     irbesartan (AVAPRO) 75 MG tablet; Take 1 tablet (75 mg  total) by mouth daily. -     rosuvastatin (CRESTOR) 10 MG tablet; Take 1 tablet (10 mg total) by mouth daily. -     metoprolol succinate (TOPROL-XL) 25 MG 24 hr tablet; Take 1 tablet (25 mg total) by mouth daily.  Prediabetes  DOE (dyspnea on exertion) -     albuterol (VENTOLIN HFA) 108 (90 Base) MCG/ACT inhaler; TAKE 2 PUFFS BY MOUTH EVERY 6 HOURS AS NEEDED FOR WHEEZE OR SHORTNESS OF BREATH  Hyperlipidemia LDL goal <70 -     rosuvastatin (CRESTOR) 10 MG tablet; Take 1 tablet (10 mg total) by mouth daily.  Psoriasis -     clobetasol (OLUX) 0.05 % topical foam; Apply topically 2 (two) times daily.   Treated for allergic sinusitis with augmentin and medrol dose pack Albuterol refilled for any SOB  Clobetasol for psoriasis as needed  Discussed medications and how he should stay on both for multiple reasons. Restart avapro but start on 1/2 tablet Continue on metoprolol Continue on statin Discussed with patient the benefit of all these medications in CV risk prevention due to his personal history   Continue follow ups with PCP      Tandy Gaw, PA-C

## 2023-08-19 NOTE — Patient Instructions (Signed)

## 2023-09-21 ENCOUNTER — Other Ambulatory Visit: Payer: Self-pay | Admitting: Family Medicine

## 2023-09-21 DIAGNOSIS — R0609 Other forms of dyspnea: Secondary | ICD-10-CM

## 2023-10-16 ENCOUNTER — Other Ambulatory Visit: Payer: Self-pay | Admitting: Physician Assistant

## 2023-10-16 DIAGNOSIS — R0609 Other forms of dyspnea: Secondary | ICD-10-CM

## 2023-11-20 ENCOUNTER — Ambulatory Visit: Payer: 59 | Admitting: Family Medicine

## 2024-03-09 ENCOUNTER — Other Ambulatory Visit: Payer: Self-pay | Admitting: Family Medicine

## 2024-03-09 DIAGNOSIS — R0609 Other forms of dyspnea: Secondary | ICD-10-CM

## 2024-03-09 NOTE — Telephone Encounter (Signed)
 Copied from CRM [id number]. Topic: Clinical - Medication Refill >> Mar 09, 2024  1:28 PM Clide Dales wrote: Most Recent Primary Care Visit:  Provider: Jomarie Longs  Department: [facility]  Visit Type: OFFICE VISIT  Date: 08/19/2023  Medication: albuterol (VENTOLIN HFA) 108 (90 Base) MCG/ACT inhaler  Has the patient contacted their pharmacy? No (Agent: If no, request that the patient contact the pharmacy for the refill. If patient does not wish to contact the pharmacy document the reason why and proceed with request.) (Agent: If yes, when and what did the pharmacy advise?)  Is this the correct pharmacy for this prescription? Yes If no, delete pharmacy and type the correct one.  This is the patient's preferred pharmacy:  [facility] [phone] - [place], [place] - [address] [place] [address] Phone: [phone] Fax: [phone]   Has the prescription been filled recently? Yes  Is the patient out of the medication? Yes  Has the patient been seen for an appointment in the last year OR does the patient have an upcoming appointment? Yes  Can we respond through MyChart? Yes  Agent: Please be advised that Rx refills may take up to 3 business days. We ask that you follow-up with your pharmacy.

## 2024-03-10 MED ORDER — ALBUTEROL SULFATE HFA 108 (90 BASE) MCG/ACT IN AERS: INHALATION_SPRAY | RESPIRATORY_TRACT | 0 refills | Status: AC

## 2024-03-23 ENCOUNTER — Other Ambulatory Visit: Payer: Self-pay | Admitting: Physician Assistant

## 2024-03-23 ENCOUNTER — Other Ambulatory Visit: Payer: Self-pay | Admitting: Family Medicine

## 2024-03-23 DIAGNOSIS — Z951 Presence of aortocoronary bypass graft: Secondary | ICD-10-CM

## 2024-03-23 DIAGNOSIS — E785 Hyperlipidemia, unspecified: Secondary | ICD-10-CM

## 2024-03-23 DIAGNOSIS — I10 Essential (primary) hypertension: Secondary | ICD-10-CM

## 2024-03-23 NOTE — Telephone Encounter (Signed)
 Pls contact the pt to schedule HTN follow-up with Dr. Ashley Royalty. Thx

## 2024-04-05 ENCOUNTER — Ambulatory Visit: Admitting: Family Medicine

## 2024-04-26 ENCOUNTER — Ambulatory Visit: Admitting: Family Medicine

## 2024-04-28 ENCOUNTER — Encounter: Payer: Self-pay | Admitting: Family Medicine

## 2024-04-28 ENCOUNTER — Ambulatory Visit: Payer: Self-pay | Admitting: Family Medicine

## 2024-04-28 VITALS — BP 135/85 | HR 80 | Ht 72.0 in | Wt 295.0 lb

## 2024-04-28 DIAGNOSIS — I10 Essential (primary) hypertension: Secondary | ICD-10-CM

## 2024-04-28 DIAGNOSIS — I25810 Atherosclerosis of coronary artery bypass graft(s) without angina pectoris: Secondary | ICD-10-CM

## 2024-04-28 DIAGNOSIS — R7303 Prediabetes: Secondary | ICD-10-CM

## 2024-04-28 NOTE — Progress Notes (Signed)
 Jon Mcbride - 63 y.o. male MRN [id number]  Date of birth: [birth year]  Subjective Chief Complaint  Patient presents with   Hypertension   Grieving   Prediabetes    HPI Jon Mcbride is a 63 y.o. male here today for follow up visit.   He reports that he is doing okay.  He has retired early because he felt that he could not keep up with the demands of his job.  He has history of CAD and previous CABG about 7 years ago.  No prior diagnosis of ischemic heart disease of CHF.  He was seeing cardiology, last visit was a year ago.  He has not had had recent Echo.  He feels like he may have long COVID as he continues to experience episodes of fatigue and dyspnea since his last episode of COVID about 18 months ago.  He is considering applying for disability for his chronic conditions.    He may also have some unmanaged grief as he has had several close family members pass away over past couple of years.  Overall he feels like he is doing pretty well.  His BP is well controlled with current medication.  He denies chest pain, palpitations, headache or vision changes.  Tolerating crestor  well for lipid control.   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    Past Surgical History:  Procedure Laterality Date   CAROTID STENT INSERTION      CORONARY ARTERY BYPASS GRAFT      Social History   Socioeconomic History   Marital status: Divorced    Spouse name: Not on file   Number of children: Not on file   Years of education: Not on file   Highest education level: Not on file  Occupational History   Not on file  Tobacco Use   Smoking status: Never   Smokeless tobacco: Never  Substance and Sexual Activity   Alcohol use: No   Drug use: No   Sexual activity: Not on file  Other Topics Concern   Not on file  Social History Narrative   Not on file   Social Drivers of Health   Financial Resource Strain: Not on file  Food Insecurity: Not on file  Transportation Needs: Not on file  Physical Activity: Not on file  Stress: Not on file  Social Connections: Unknown (04/27/2022)   Received from [facility]   Social Network    Social Network: Not on file    Family History  Problem Relation Age of Onset   Hypertension Father    Hyperlipidemia Father    Cancer Mother    Hyperlipidemia Mother     Health Maintenance  Topic Date Due   Pneumococcal Vaccine 16-62 Years old (1 of 2 - PCV) Never done   Fecal DNA (Cologuard)  Never  done   Zoster Vaccines- Shingrix (2 of 2) 08/27/2021   COVID-19 Vaccine (1 - 2024-25 season) Never done   INFLUENZA VACCINE  07/16/2024   DTaP/Tdap/Td (3 - Td or Tdap) 05/20/2033   Hepatitis C Screening  Completed   HIV Screening  Completed   HPV VACCINES  Aged Out   Meningococcal B Vaccine  Aged Out     ----------------------------------------------------------------------------------------------------------------------------------------------------------------------------------------------------------------- Physical Exam BP 135/85 (BP Location: Left Arm, Patient Position: Sitting, Cuff Size: Large)   Pulse 80   Ht 6' (1.829 m)   Wt 295 lb (133.8 kg)   SpO2 97%   BMI 40.01 kg/m   Physical Exam Constitutional:      Appearance: Normal appearance.  HENT:     Head:  Normocephalic and atraumatic.  Eyes:     General: No scleral icterus. Cardiovascular:     Rate and Rhythm: Normal rate and regular rhythm.  Pulmonary:     Effort: Pulmonary effort is normal.     Breath sounds: Normal breath sounds.  Musculoskeletal:     Cervical back: Neck supple.  Neurological:     Mental Status: He is alert.  Psychiatric:        Mood and Affect: Mood normal.        Behavior: Behavior normal.     ------------------------------------------------------------------------------------------------------------------------------------------------------------------------------------------------------------------- Assessment and Plan  HTN (hypertension), benign Blood pressure mains well-controlled.  Recommend continuation of current medications for management of hypertension.    Coronary artery disease Seeing cardiology previously but has not been seen in quite some time.  Did not care for last cardiologist and does not want to return.  Reports he cannot tolerate statins.  We may need to look into patient assistance for him to have Repatha or Praluent .  He would prefer to hold off on adding anything at this time.   Prediabetes He has been working on dietary changes.  Will plan to update labs at next visit.   No orders of the defined types were placed in this encounter.   Return in about 4 months (around 08/29/2024) for Hypertension.

## 2024-04-28 NOTE — Assessment & Plan Note (Addendum)
 Seeing cardiology previously but has not been seen in quite some time.  Did not care for last cardiologist and does not want to return.  Reports he cannot tolerate statins.  We may need to look into patient assistance for him to have Repatha or Praluent .  He would prefer to hold off on adding anything at this time.

## 2024-04-28 NOTE — Assessment & Plan Note (Signed)
Blood pressure mains well-controlled.  Recommend continuation of current medications for management of hypertension.   

## 2024-04-28 NOTE — Assessment & Plan Note (Signed)
 He has been working on dietary changes.  Will plan to update labs at next visit.

## 2024-06-19 ENCOUNTER — Other Ambulatory Visit: Payer: Self-pay | Admitting: Family Medicine

## 2024-06-19 DIAGNOSIS — Z951 Presence of aortocoronary bypass graft: Secondary | ICD-10-CM

## 2024-06-19 DIAGNOSIS — I10 Essential (primary) hypertension: Secondary | ICD-10-CM

## 2024-07-11 ENCOUNTER — Other Ambulatory Visit: Payer: Self-pay | Admitting: Family Medicine

## 2024-08-17 ENCOUNTER — Encounter: Payer: Self-pay | Admitting: Sports Medicine

## 2024-08-30 ENCOUNTER — Ambulatory Visit: Payer: Self-pay | Admitting: Family Medicine

## 2024-09-03 ENCOUNTER — Other Ambulatory Visit: Payer: Self-pay | Admitting: Family Medicine

## 2024-09-03 DIAGNOSIS — I10 Essential (primary) hypertension: Secondary | ICD-10-CM

## 2024-09-03 DIAGNOSIS — Z951 Presence of aortocoronary bypass graft: Secondary | ICD-10-CM

## 2024-09-13 ENCOUNTER — Other Ambulatory Visit: Payer: Self-pay | Admitting: Family Medicine

## 2024-09-13 DIAGNOSIS — I10 Essential (primary) hypertension: Secondary | ICD-10-CM

## 2024-09-13 DIAGNOSIS — Z951 Presence of aortocoronary bypass graft: Secondary | ICD-10-CM

## 2024-09-13 MED ORDER — IRBESARTAN 75 MG PO TABS: 75.0000 mg | ORAL_TABLET | Freq: Every day | ORAL | 0 refills | Status: DC

## 2024-09-13 NOTE — Telephone Encounter (Signed)
 Copied from CRM #[id number]. Topic: Clinical - Medication Refill >> Sep 13, 2024 11:24 AM Avram MATSU wrote: Medication: irbesartan  (AVAPRO ) 75 MG tablet [[id number]]  Has the patient contacted their pharmacy? Yes (Agent: If no, request that the patient contact the pharmacy for the refill. If patient does not wish to contact the pharmacy document the reason why and proceed with request.) (Agent: If yes, when and what did the pharmacy advise?)  This is the patient's preferred pharmacy:  [id number] - [place], [place] - [address] [place] [address] Phone: [phone] Fax: [phone]  Is this the correct pharmacy for this prescription? Yes If no, delete pharmacy and type the correct one.   Has the prescription been filled recently? No  Is the patient out of the medication? No 2 left  Has the patient been seen for an appointment in the last year OR does the patient have an upcoming appointment? Yes  Can we respond through MyChart? No  Agent: Please be advised that Rx refills may take up to 3 business days. We ask that you follow-up with your pharmacy.

## 2024-09-30 ENCOUNTER — Telehealth: Payer: Self-pay

## 2024-09-30 NOTE — Telephone Encounter (Signed)
 Jon Mcbride doesn't have insurance at the moment. He will wait on the Cologuard for when he has insurance.

## 2024-11-07 ENCOUNTER — Other Ambulatory Visit: Payer: Self-pay | Admitting: Physician Assistant

## 2024-11-07 DIAGNOSIS — Z951 Presence of aortocoronary bypass graft: Secondary | ICD-10-CM

## 2024-11-07 DIAGNOSIS — I10 Essential (primary) hypertension: Secondary | ICD-10-CM

## 2024-11-09 ENCOUNTER — Telehealth: Payer: Self-pay

## 2024-11-09 NOTE — Telephone Encounter (Unsigned)
 Copied from CRM #[id number]. Topic: Clinical - Medication Refill >> Nov 09, 2024  4:49 PM Winona R wrote: Medication:metoprolol  succinate (TOPROL -XL) 25 MG 24 hr tablet  Has the patient contacted their pharmacy? Yes (Agent: If no, request that the patient contact the pharmacy for the refill. If patient does not wish to contact the pharmacy document the reason why and proceed with request.) (Agent: If yes, when and what did the pharmacy advise?)  This is the patient's preferred pharmacy:  [id number] - [place], [place] - [address] [place] [address] Phone: [phone] Fax: [phone]  Is this the correct pharmacy for this prescription? Yes If no, delete pharmacy and type the correct one.   Has the prescription been filled recently? No  Is the patient out of the medication? No  Has the patient been seen for an appointment in the last year OR does the patient have an upcoming appointment? Yes  Can we respond through MyChart? Yes  Agent: Please be advised that Rx refills may take up to 3 business days. We ask that you follow-up with your pharmacy.

## 2024-11-09 NOTE — Telephone Encounter (Signed)
 Copied from CRM #[id number]. Topic: Clinical - Medication Refill >> Nov 09, 2024  4:49 PM Winona R wrote: Medication:metoprolol  succinate (TOPROL -XL) 25 MG 24 hr tablet  Has the patient contacted their pharmacy? Yes (Agent: If no, request that the patient contact the pharmacy for the refill. If patient does not wish to contact the pharmacy document the reason why and proceed with request.) (Agent: If yes, when and what did the pharmacy advise?)  This is the patient's preferred pharmacy:  [id number] - [place], [place] - [address] [facility] [address] [place] [address] Phone: [phone] Fax: [phone]  Is this the correct pharmacy for this prescription? Yes If no, delete pharmacy and type the correct one.   Has the prescription been filled recently? No  Is the patient out of the medication? No  Has the patient been seen for an appointment in the last year OR does the patient have an upcoming appointment? Yes  Can we respond through MyChart? Yes  Agent: Please be advised that Rx refills may take up to 3 business days. We ask that you follow-up with your pharmacy.

## 2024-11-10 ENCOUNTER — Other Ambulatory Visit: Payer: Self-pay

## 2024-11-10 DIAGNOSIS — Z951 Presence of aortocoronary bypass graft: Secondary | ICD-10-CM

## 2024-11-10 DIAGNOSIS — I10 Essential (primary) hypertension: Secondary | ICD-10-CM

## 2024-11-10 NOTE — Telephone Encounter (Signed)
 11/10/24  2:01 PM Hello Seena , Dr. Alvia has sent a prescription refill of Toprol  XL 25mg  to    [id number] - [place], [place] - [address] [facility] [address], [place] [address] Phone: [phone]   Thank you,  Luke Plenty, LPN

## 2024-12-14 ENCOUNTER — Other Ambulatory Visit: Payer: Self-pay | Admitting: Family Medicine

## 2024-12-14 DIAGNOSIS — Z951 Presence of aortocoronary bypass graft: Secondary | ICD-10-CM

## 2024-12-14 DIAGNOSIS — I10 Essential (primary) hypertension: Secondary | ICD-10-CM

## 2025-01-10 ENCOUNTER — Other Ambulatory Visit: Payer: Self-pay | Admitting: Family Medicine

## 2025-01-10 DIAGNOSIS — I10 Essential (primary) hypertension: Secondary | ICD-10-CM

## 2025-01-10 DIAGNOSIS — Z951 Presence of aortocoronary bypass graft: Secondary | ICD-10-CM

## 2025-03-01 ENCOUNTER — Ambulatory Visit: Payer: Self-pay | Admitting: Family Medicine
